# Patient Record
Sex: Male | Born: 1955
Health system: Southern US, Community
[De-identification: ages and names within clinical notes are randomized; demographics above are authoritative.]

## PROBLEM LIST (undated history)

## (undated) DIAGNOSIS — I4510 Unspecified right bundle-branch block: Secondary | ICD-10-CM

## (undated) DIAGNOSIS — E7800 Pure hypercholesterolemia, unspecified: Secondary | ICD-10-CM

## (undated) DIAGNOSIS — D649 Anemia, unspecified: Secondary | ICD-10-CM

## (undated) DIAGNOSIS — K219 Gastro-esophageal reflux disease without esophagitis: Secondary | ICD-10-CM

## (undated) DIAGNOSIS — H409 Unspecified glaucoma: Secondary | ICD-10-CM

## (undated) DIAGNOSIS — I10 Essential (primary) hypertension: Secondary | ICD-10-CM

## (undated) HISTORY — DX: Unspecified right bundle-branch block: I45.10

## (undated) HISTORY — DX: Unspecified glaucoma: H40.9

## (undated) HISTORY — PX: UPPER GASTROINTESTINAL ENDOSCOPY: SHX188

## (undated) HISTORY — DX: Essential (primary) hypertension: I10

## (undated) HISTORY — DX: Anemia, unspecified: D64.9

## (undated) HISTORY — DX: Pure hypercholesterolemia, unspecified: E78.00

## (undated) HISTORY — PX: HERNIA REPAIR: SHX51

## (undated) HISTORY — PX: UVULECTOMY: SHX2631

## (undated) HISTORY — PX: COLONOSCOPY: SHX174

## (undated) HISTORY — DX: Gastro-esophageal reflux disease without esophagitis: K21.9

---

## 2013-07-26 DIAGNOSIS — F32A Depression, unspecified: Secondary | ICD-10-CM | POA: Insufficient documentation

## 2013-07-26 DIAGNOSIS — F988 Other specified behavioral and emotional disorders with onset usually occurring in childhood and adolescence: Secondary | ICD-10-CM | POA: Insufficient documentation

## 2013-07-26 DIAGNOSIS — L821 Other seborrheic keratosis: Secondary | ICD-10-CM | POA: Insufficient documentation

## 2013-07-30 DIAGNOSIS — G4700 Insomnia, unspecified: Secondary | ICD-10-CM | POA: Insufficient documentation

## 2013-07-30 DIAGNOSIS — F5101 Primary insomnia: Secondary | ICD-10-CM | POA: Insufficient documentation

## 2013-09-29 LAB — HM COLONOSCOPY

## 2014-06-17 ENCOUNTER — Ambulatory Visit (INDEPENDENT_AMBULATORY_CARE_PROVIDER_SITE_OTHER): Payer: BLUE CROSS/BLUE SHIELD | Admitting: Internal Medicine

## 2014-06-17 DIAGNOSIS — Z9189 Other specified personal risk factors, not elsewhere classified: Secondary | ICD-10-CM

## 2014-06-17 DIAGNOSIS — Z789 Other specified health status: Secondary | ICD-10-CM

## 2014-06-17 DIAGNOSIS — Z7189 Other specified counseling: Secondary | ICD-10-CM | POA: Diagnosis not present

## 2014-06-17 DIAGNOSIS — Z23 Encounter for immunization: Secondary | ICD-10-CM

## 2014-06-17 DIAGNOSIS — Z7184 Encounter for health counseling related to travel: Secondary | ICD-10-CM

## 2014-06-17 MED ORDER — CIPROFLOXACIN HCL 500 MG PO TABS: 500.0000 mg | ORAL_TABLET | Freq: Two times a day (BID) | ORAL | Status: DC

## 2014-06-17 MED ORDER — TYPHOID VACCINE PO CPDR: 1.0000 | DELAYED_RELEASE_CAPSULE | ORAL | Status: DC

## 2014-06-17 MED ORDER — ATOVAQUONE-PROGUANIL HCL 250-100 MG PO TABS: 1.0000 | ORAL_TABLET | Freq: Every day | ORAL | Status: DC

## 2014-06-17 NOTE — Progress Notes (Signed)
   Subjective:    Patient ID: Jerry Martin, male    DOB: [birth year], 59 y.o.   MRN: [id number]  HPI  59yo M in good state of health who is going to [place] for 1 week on mission trip, July 9 ht thru 16th  Review of Systems     Objective:   Physical Exam        Assessment & Plan:  Pre-travel vaccination = gave rx for vivotif, and hepatitis A  Traveler's diarrhea = gave precautions and rx for cipro if needed  Malaria proph = gave malarone and [place] bite prevention recs

## 2015-09-12 DIAGNOSIS — F4322 Adjustment disorder with anxiety: Secondary | ICD-10-CM | POA: Insufficient documentation

## 2016-11-18 DIAGNOSIS — M47812 Spondylosis without myelopathy or radiculopathy, cervical region: Secondary | ICD-10-CM | POA: Insufficient documentation

## 2018-10-20 ENCOUNTER — Telehealth: Payer: Self-pay | Admitting: Internal Medicine

## 2018-10-20 NOTE — Telephone Encounter (Signed)
I will see him  TJ 

## 2018-10-20 NOTE — Telephone Encounter (Signed)
Copied from [place] [id number]. Topic: General - Other >> Oct 20, 2018 11:32 AM Virl Axe D wrote: Reason for CRM: Pt would like to see if any male physician in practice would accept her husband as a new patient. Pt's wife Pt's wife Jerry Martin 09/23/63 and father in law are current pt's of Dr. Quay Burow. Advised that no one in office is accepting new patients but it is up to provider.

## 2018-10-22 NOTE — Telephone Encounter (Signed)
Scheduled

## 2018-10-28 ENCOUNTER — Ambulatory Visit: Payer: Self-pay | Admitting: Internal Medicine

## 2019-02-10 ENCOUNTER — Other Ambulatory Visit: Payer: Self-pay

## 2019-02-10 ENCOUNTER — Encounter: Payer: Self-pay | Admitting: Internal Medicine

## 2019-02-10 ENCOUNTER — Ambulatory Visit (INDEPENDENT_AMBULATORY_CARE_PROVIDER_SITE_OTHER): Payer: PRIVATE HEALTH INSURANCE | Admitting: Internal Medicine

## 2019-02-10 VITALS — BP 160/92 | HR 55 | Temp 98.1°F | Resp 16 | Ht 73.0 in | Wt 222.0 lb

## 2019-02-10 DIAGNOSIS — Z23 Encounter for immunization: Secondary | ICD-10-CM | POA: Diagnosis not present

## 2019-02-10 DIAGNOSIS — K635 Polyp of colon: Secondary | ICD-10-CM | POA: Insufficient documentation

## 2019-02-10 DIAGNOSIS — I10 Essential (primary) hypertension: Secondary | ICD-10-CM | POA: Diagnosis not present

## 2019-02-10 DIAGNOSIS — E785 Hyperlipidemia, unspecified: Secondary | ICD-10-CM | POA: Diagnosis not present

## 2019-02-10 DIAGNOSIS — K582 Mixed irritable bowel syndrome: Secondary | ICD-10-CM

## 2019-02-10 DIAGNOSIS — Z0000 Encounter for general adult medical examination without abnormal findings: Secondary | ICD-10-CM | POA: Diagnosis not present

## 2019-02-10 DIAGNOSIS — R0989 Other specified symptoms and signs involving the circulatory and respiratory systems: Secondary | ICD-10-CM | POA: Diagnosis not present

## 2019-02-10 DIAGNOSIS — K589 Irritable bowel syndrome without diarrhea: Secondary | ICD-10-CM | POA: Insufficient documentation

## 2019-02-10 DIAGNOSIS — Z1159 Encounter for screening for other viral diseases: Secondary | ICD-10-CM

## 2019-02-10 LAB — CBC WITH DIFFERENTIAL/PLATELET
Basophils Absolute: 0 10*3/uL (ref 0.0–0.1)
Basophils Relative: 0.3 % (ref 0.0–3.0)
Eosinophils Absolute: 0.2 10*3/uL (ref 0.0–0.7)
Eosinophils Relative: 2.5 % (ref 0.0–5.0)
HCT: 43.6 % (ref 39.0–52.0)
Hemoglobin: 14.5 g/dL (ref 13.0–17.0)
Lymphocytes Relative: 23.1 % (ref 12.0–46.0)
Lymphs Abs: 1.5 10*3/uL (ref 0.7–4.0)
MCHC: 33.3 g/dL (ref 30.0–36.0)
MCV: 95.4 fl (ref 78.0–100.0)
Monocytes Absolute: 0.5 10*3/uL (ref 0.1–1.0)
Monocytes Relative: 7.7 % (ref 3.0–12.0)
Neutro Abs: 4.2 10*3/uL (ref 1.4–7.7)
Neutrophils Relative %: 66.4 % (ref 43.0–77.0)
Platelets: 269 10*3/uL (ref 150.0–400.0)
RBC: 4.57 Mil/uL (ref 4.22–5.81)
RDW: 13.5 % (ref 11.5–15.5)
WBC: 6.3 10*3/uL (ref 4.0–10.5)

## 2019-02-10 LAB — HEPATIC FUNCTION PANEL
ALT: 19 U/L (ref 0–53)
AST: 16 U/L (ref 0–37)
Albumin: 4.1 g/dL (ref 3.5–5.2)
Alkaline Phosphatase: 83 U/L (ref 39–117)
Bilirubin, Direct: 0.1 mg/dL (ref 0.0–0.3)
Total Bilirubin: 0.4 mg/dL (ref 0.2–1.2)
Total Protein: 6.6 g/dL (ref 6.0–8.3)

## 2019-02-10 LAB — URINALYSIS, ROUTINE W REFLEX MICROSCOPIC
Bilirubin Urine: NEGATIVE
Hgb urine dipstick: NEGATIVE
Ketones, ur: NEGATIVE
Leukocytes,Ua: NEGATIVE
Nitrite: NEGATIVE
RBC / HPF: NONE SEEN (ref 0–?)
Specific Gravity, Urine: 1.025 (ref 1.000–1.030)
Total Protein, Urine: NEGATIVE
Urine Glucose: NEGATIVE
Urobilinogen, UA: 0.2 (ref 0.0–1.0)
WBC, UA: NONE SEEN (ref 0–?)
pH: 5.5 (ref 5.0–8.0)

## 2019-02-10 LAB — BASIC METABOLIC PANEL
BUN: 17 mg/dL (ref 6–23)
CO2: 27 mEq/L (ref 19–32)
Calcium: 9.2 mg/dL (ref 8.4–10.5)
Chloride: 105 mEq/L (ref 96–112)
Creatinine, Ser: 1.07 mg/dL (ref 0.40–1.50)
GFR: 69.23 mL/min (ref >60.00)
Glucose, Bld: 102 mg/dL — ABNORMAL HIGH (ref 70–99)
Potassium: 4.6 mEq/L (ref 3.5–5.1)
Sodium: 141 mEq/L (ref 135–145)

## 2019-02-10 LAB — LIPID PANEL
Cholesterol: 216 mg/dL — ABNORMAL HIGH (ref 0–200)
HDL: 65.2 mg/dL (ref >39.00)
LDL Cholesterol: 138 mg/dL — ABNORMAL HIGH (ref 0–99)
NonHDL: 150.77
Total CHOL/HDL Ratio: 3
Triglycerides: 65 mg/dL (ref 0.0–149.0)
VLDL: 13 mg/dL (ref 0.0–40.0)

## 2019-02-10 LAB — TSH: TSH: 1.35 u[IU]/mL (ref 0.35–4.50)

## 2019-02-10 LAB — PSA: PSA: 0.31 ng/mL (ref 0.10–4.00)

## 2019-02-10 LAB — VITAMIN D 25 HYDROXY (VIT D DEFICIENCY, FRACTURES): VITD: 30.62 ng/mL (ref 30.00–100.00)

## 2019-02-10 MED ORDER — ROSUVASTATIN CALCIUM 20 MG PO TABS: 20.0000 mg | ORAL_TABLET | Freq: Every day | ORAL | 1 refills | Status: DC

## 2019-02-10 MED ORDER — EDARBI 40 MG PO TABS: 1.0000 | ORAL_TABLET | Freq: Every day | ORAL | 0 refills | Status: DC

## 2019-02-10 NOTE — Patient Instructions (Addendum)

## 2019-02-10 NOTE — Progress Notes (Addendum)
Subjective:  Patient ID: Jerry Martin, male    DOB: 07-21-1955  Age: 63 y.o. MRN: [id number]  CC: Annual Exam and Hypertension  This visit occurred during the SARS-CoV-2 public health emergency.  Safety protocols were in place, including screening questions prior to the visit, additional usage of staff PPE, and extensive cleaning of exam room while observing appropriate contact time as indicated for disinfecting solutions.   NEW TO ME  HPI Jerry PhoDavid Scatena presents for a CPX.  He complains of having bowel issues for more than a year.  He has had intermittent abdominal cramping, stool urgency, stool frequency, and now more recently constipation.  He never notices blood, mucus, or tarry stools.  He has mild heartburn that is well controlled with Pepcid.  He tells me he had a colonoscopy about a year 5 years ago.  He complains of weight gain and denies nausea, vomiting, odynophagia, or dysphagia.  He has a history of hypertension and hypercholesterolemia.  Neither of these have been recently treated.  He is very active. He exercises and denies any recent episodes of CP, DOE, palpitations, diaphoresis, edema, or fatigue.  History Onalee HuaDavid has a past medical history of GERD (gastroesophageal reflux disease), Hypercholesteremia, and Hypertension.   He has a past surgical history that includes Hernia repair and Uvulectomy.   His family history includes Hypertension in his mother.He reports that he has been smoking cigars. He has never used smokeless tobacco. He reports current alcohol use of about 15.0 standard drinks of alcohol per week. He reports current drug use. Drug: Marijuana.  Outpatient Medications Prior to Visit  Medication Sig Dispense Refill  . sertraline (ZOLOFT) 50 MG tablet   3  . amphetamine-dextroamphetamine (ADDERALL) 30 MG tablet   0  . atovaquone-proguanil (MALARONE) 250-100 MG TABS Take 1 tablet by mouth daily. Start taking on July 7th, take daily with food until complete (Patient not  taking: Reported on 02/10/2019) 16 tablet 0  . ciprofloxacin (CIPRO) 500 MG tablet Take 1 tablet (500 mg total) by mouth 2 (two) times daily. If needed if 3+loose stools or more in 24hr. stop taking if diarrhea resolves (Patient not taking: Reported on 02/10/2019) 10 tablet 0  . traZODone (DESYREL) 50 MG tablet   5  . typhoid (VIVOTIF) DR capsule Take 1 capsule by mouth every other day. (Patient not taking: Reported on 02/10/2019) 4 capsule 0   No facility-administered medications prior to visit.    ROS Review of Systems  Constitutional: Positive for unexpected weight change. Negative for diaphoresis and fatigue.  HENT: Negative.  Negative for trouble swallowing and voice change.   Eyes: Negative.   Respiratory: Negative for cough, chest tightness, shortness of breath and wheezing.   Cardiovascular: Negative for chest pain and leg swelling.  Gastrointestinal: Positive for abdominal pain, constipation and diarrhea. Negative for abdominal distention, anal bleeding, blood in stool, nausea, rectal pain and vomiting.  Endocrine: Negative.   Genitourinary: Negative.  Negative for difficulty urinating, dysuria, hematuria, testicular pain and urgency.  Musculoskeletal: Negative.   Skin: Negative.   Neurological: Negative.  Negative for dizziness, syncope and light-headedness.  Hematological: Negative.   Psychiatric/Behavioral: Negative.     Objective:  BP (!) 160/92 (BP Location: Left Arm, Patient Position: Sitting, Cuff Size: Large) Comment: BP (R) 158/94 (L) 160/92  Pulse (!) 55   Temp 98.1 F (36.7 C) (Oral)   Resp 16   Ht 6\' 1"  (1.854 m)   Wt 222 lb (100.7 kg)   SpO2 97%  BMI 29.29 kg/m   Physical Exam Vitals reviewed.  Constitutional:      Appearance: Normal appearance.  HENT:     Nose: Nose normal.     Mouth/Throat:     Mouth: Mucous membranes are moist.  Eyes:     General: No scleral icterus.    Conjunctiva/sclera: Conjunctivae normal.  Cardiovascular:     Rate and  Rhythm: Regular rhythm. Bradycardia present.     Pulses:          Carotid pulses are 1+ on the right side and 1+ on the left side.      Radial pulses are 1+ on the right side and 1+ on the left side.       Femoral pulses are 1+ on the right side and 1+ on the left side.      Popliteal pulses are 1+ on the right side and 1+ on the left side.       Dorsalis pedis pulses are 1+ on the right side and 1+ on the left side.       Posterior tibial pulses are 1+ on the right side and 1+ on the left side.     Heart sounds: No murmur. No gallop.      Comments: EKG -  Sinus bradycardia. 1st degree AV block. Left axis deviation. No LVH. Pulmonary:     Effort: Pulmonary effort is normal.     Breath sounds: No stridor. No wheezing, rhonchi or rales.  Abdominal:     General: Abdomen is flat. Bowel sounds are normal. There is abdominal bruit. There is no distension.     Palpations: Abdomen is soft. There is no hepatomegaly or splenomegaly.     Tenderness: There is no abdominal tenderness.     Hernia: No hernia is present. There is no hernia in the left inguinal area.  Genitourinary:    Pubic Area: No rash.      Penis: Normal. No discharge, swelling or lesions.      Testes: Normal.        Right: Mass or tenderness not present.        Left: Mass or tenderness not present.     Epididymis:     Right: Normal. Not inflamed or enlarged. No mass or tenderness.     Left: Not inflamed or enlarged. No mass or tenderness.     Prostate: Normal. Not enlarged, not tender and no nodules present.     Rectum: Normal. Guaiac result negative. No mass, tenderness, anal fissure, external hemorrhoid or internal hemorrhoid. Normal anal tone.  Musculoskeletal:        General: Normal range of motion.     Cervical back: Neck supple.     Right lower leg: No edema.     Left lower leg: No edema.  Lymphadenopathy:     Cervical: No cervical adenopathy.     Lower Body: No left inguinal adenopathy.  Skin:    General: Skin is  warm and dry.     Findings: No rash.  Neurological:     General: No focal deficit present.     Mental Status: He is alert.  Psychiatric:        Mood and Affect: Mood normal.        Behavior: Behavior normal.     Lab Results  Component Value Date   WBC 6.3 02/10/2019   HGB 14.5 02/10/2019   HCT 43.6 02/10/2019   PLT 269.0 02/10/2019   GLUCOSE 102 (H) 02/10/2019  CHOL 216 (H) 02/10/2019   TRIG 65.0 02/10/2019   HDL 65.20 02/10/2019   LDLCALC 138 (H) 02/10/2019   ALT 19 02/10/2019   AST 16 02/10/2019   NA 141 02/10/2019   K 4.6 02/10/2019   CL 105 02/10/2019   CREATININE 1.07 02/10/2019   BUN 17 02/10/2019   CO2 27 02/10/2019   TSH 1.35 02/10/2019   PSA 0.31 02/10/2019    Assessment & Plan:   Derran was seen today for annual exam and hypertension.  Diagnoses and all orders for this visit:  Need for pneumococcal vaccination -     Pneumococcal conjugate vaccine 13-valent  Routine general medical examination at a health care facility- Exam completed, labs reviewed, vaccines reviewed and updated, he is due for f/up colonoscopy with + hx of polyp, pt ed was given. -     Lipid panel -     PSA -     Hepatitis C antibody -     HIV antibody (Reflex)  Need for hepatitis C screening test -     Hepatitis C antibody  Essential hypertension- His BP is not well controlled. Labs are negative for end organ damage or secondary causes. I have asked him to start an ARB. -     CBC with Differential -     Basic metabolic panel -     Vitamin D 25 hydroxy -     Urinalysis, Routine w reflex microscopic -     TSH -     Hepatic function panel -     EKG 12-Lead -     Azilsartan Medoxomil (EDARBI) 40 MG TABS; Take 1 tablet by mouth daily.  Irritable bowel syndrome with both constipation and diarrhea- He does not want to treat this with a medication. Education material was given and reassurance offered. -     CBC with Differential  Polyp of colon, unspecified part of colon,  unspecified type -     Ambulatory referral to Gastroenterology  Abdominal bruit- Will screen for AAA. -     VAS [place] AAA DUPLEX; Future  Hyperlipidemia with target LDL less than 130- He has an elevated CV risk score so I have asked him to start a statin for CV risk reduction. -     rosuvastatin (CRESTOR) 20 MG tablet; Take 1 tablet (20 mg total) by mouth daily.   I have discontinued Shanon Brow Keast's amphetamine-dextroamphetamine, traZODone, ciprofloxacin, typhoid, and atovaquone-proguanil. I am also having him start on rosuvastatin and Edarbi. Additionally, I am having him maintain his sertraline.  Meds ordered this encounter  Medications  . rosuvastatin (CRESTOR) 20 MG tablet    Sig: Take 1 tablet (20 mg total) by mouth daily.    Dispense:  90 tablet    Refill:  1  . Azilsartan Medoxomil (EDARBI) 40 MG TABS    Sig: Take 1 tablet by mouth daily.    Dispense:  90 tablet    Refill:  0     Follow-up: Return in about 6 weeks (around 03/24/2019).  Scarlette Calico, MD

## 2019-02-11 ENCOUNTER — Encounter: Payer: Self-pay | Admitting: Internal Medicine

## 2019-02-11 LAB — HEPATITIS C ANTIBODY
Hepatitis C Ab: NONREACTIVE
SIGNAL TO CUT-OFF: 0.01 (ref <1.00)

## 2019-02-11 LAB — HIV ANTIBODY (ROUTINE TESTING W REFLEX): HIV 1&2 Ab, 4th Generation: NONREACTIVE

## 2019-02-13 ENCOUNTER — Encounter: Payer: Self-pay | Admitting: Internal Medicine

## 2019-02-15 ENCOUNTER — Telehealth (HOSPITAL_COMMUNITY): Payer: Self-pay | Admitting: *Deleted

## 2019-02-15 NOTE — Telephone Encounter (Signed)
02/15/19 1117 am spoke with wife asking to have patient return my call to schedule.  Since patient has not been to our office there is no HIPPA form allowing me to schedule with the patients wife.

## 2019-02-17 ENCOUNTER — Other Ambulatory Visit: Payer: Self-pay

## 2019-02-17 ENCOUNTER — Other Ambulatory Visit: Payer: Self-pay | Admitting: Internal Medicine

## 2019-02-17 ENCOUNTER — Ambulatory Visit (HOSPITAL_COMMUNITY)
Admission: RE | Admit: 2019-02-17 | Discharge: 2019-02-17 | Disposition: A | Discharge status: Home / Self Care | Payer: PRIVATE HEALTH INSURANCE | Source: Ambulatory Visit | Attending: Internal Medicine | Admitting: Internal Medicine

## 2019-02-17 ENCOUNTER — Encounter: Payer: Self-pay | Admitting: Internal Medicine

## 2019-02-17 DIAGNOSIS — I771 Stricture of artery: Secondary | ICD-10-CM

## 2019-02-17 DIAGNOSIS — R0989 Other specified symptoms and signs involving the circulatory and respiratory systems: Secondary | ICD-10-CM | POA: Insufficient documentation

## 2019-02-25 ENCOUNTER — Encounter: Payer: Self-pay | Admitting: Internal Medicine

## 2019-03-10 NOTE — Telephone Encounter (Signed)
Can an alternative to [place] be sent in please? I have tried on multiple occassions but was unsuccessful.

## 2019-03-11 ENCOUNTER — Other Ambulatory Visit: Payer: Self-pay | Admitting: Internal Medicine

## 2019-03-11 DIAGNOSIS — I10 Essential (primary) hypertension: Secondary | ICD-10-CM

## 2019-03-11 MED ORDER — IRBESARTAN 150 MG PO TABS: 150.0000 mg | ORAL_TABLET | Freq: Every day | ORAL | 1 refills | Status: DC

## 2019-03-16 ENCOUNTER — Ambulatory Visit (HOSPITAL_COMMUNITY)
Admission: RE | Admit: 2019-03-16 | Discharge: 2019-03-16 | Disposition: A | Discharge status: Home / Self Care | Payer: PRIVATE HEALTH INSURANCE | Source: Ambulatory Visit | Attending: Vascular Surgery | Admitting: Vascular Surgery

## 2019-03-16 ENCOUNTER — Other Ambulatory Visit: Payer: Self-pay

## 2019-03-16 ENCOUNTER — Encounter: Payer: Self-pay | Admitting: Vascular Surgery

## 2019-03-16 ENCOUNTER — Ambulatory Visit (INDEPENDENT_AMBULATORY_CARE_PROVIDER_SITE_OTHER): Payer: PRIVATE HEALTH INSURANCE | Admitting: Vascular Surgery

## 2019-03-16 VITALS — BP 114/73 | HR 55 | Temp 98.0°F | Resp 20 | Ht 73.0 in | Wt 216.0 lb

## 2019-03-16 DIAGNOSIS — I739 Peripheral vascular disease, unspecified: Secondary | ICD-10-CM

## 2019-03-16 DIAGNOSIS — I771 Stricture of artery: Secondary | ICD-10-CM | POA: Insufficient documentation

## 2019-03-16 NOTE — Progress Notes (Signed)
[facility]  Patient name: Jerry Martin MRN: [id number] DOB: 11-Jul-1955 Sex: male  REASON FOR CONSULT: Evaluation of aortic ectasia and left common iliac artery stenosis  HPI: Jerry Martin is a 64 y.o. male, who is here today for evaluation.  She underwent basic studies for potential aneurysm screen and also possible abdominal bruit.  This revealed maximal diameter of his aorta to be 2.6 cm.  There was some question of left common iliac artery stenosis.  He is here today for formal duplex of his iliac artery and office visit.  He has no symptoms of lower extremity claudication.  He is quite active.  He does report some hip discomfort which is not related to walking but sounds more arthritic.  He did have some left leg swelling in the past which is completely resolved.  No history of DVT.  No history of cardiac disease.  He does smoke cigars but not cigarettes  Past Medical History:  Diagnosis Date  . GERD (gastroesophageal reflux disease)   . Hypercholesteremia   . Hypertension     Family History  Problem Relation Age of Onset  . Hypertension Mother     SOCIAL HISTORY: Social History   Socioeconomic History  . Marital status: Married    Spouse name: Not on file  . Number of children: Not on file  . Years of education: Not on file  . Highest education level: Not on file  Occupational History  . Not on file  Tobacco Use  . Smoking status: Current Some Day Smoker    Types: Cigars  . Smokeless tobacco: Never Used  Substance and Sexual Activity  . Alcohol use: Yes    Alcohol/week: 15.0 standard drinks    Types: 15 Shots of liquor per week  . Drug use: Yes    Types: Marijuana  . Sexual activity: Yes    Partners: Female  Other Topics Concern  . Not on file  Social History Narrative  . Not on file   Social Determinants of Health   Financial Resource Strain:   . Difficulty of Paying Living Expenses: Not on file   Food Insecurity:   . Worried About Programme researcher, broadcasting/film/video in the Last Year: Not on file  . Ran Out of Food in the Last Year: Not on file  Transportation Needs:   . Lack of Transportation (Medical): Not on file  . Lack of Transportation (Non-Medical): Not on file  Physical Activity:   . Days of Exercise per Week: Not on file  . Minutes of Exercise per Session: Not on file  Stress:   . Feeling of Stress : Not on file  Social Connections:   . Frequency of Communication with Friends and Family: Not on file  . Frequency of Social Gatherings with Friends and Family: Not on file  . Attends Religious Services: Not on file  . Active Member of Clubs or Organizations: Not on file  . Attends Banker Meetings: Not on file  . Marital Status: Not on file  Intimate Partner Violence:   . Fear of Current or Ex-Partner: Not on file  . Emotionally Abused: Not on file  . Physically Abused: Not on file  . Sexually Abused: Not on file    Allergies  Allergen Reactions  . Penicillins     Current Outpatient Medications  Medication Sig Dispense Refill  . irbesartan (AVAPRO) 150 MG tablet Take 1 tablet (150 mg total) by mouth daily. 90 tablet  1  . rosuvastatin (CRESTOR) 20 MG tablet Take 1 tablet (20 mg total) by mouth daily. 90 tablet 1  . sertraline (ZOLOFT) 50 MG tablet   3   No current facility-administered medications for this visit.    REVIEW OF SYSTEMS:  [X]  denotes positive finding, [ ]  denotes negative finding Cardiac  Comments:  Chest pain or chest pressure:    Shortness of breath upon exertion:    Short of breath when lying flat:    Irregular heart rhythm:        Vascular    Pain in calf, thigh, or hip brought on by ambulation:    Pain in feet at night that wakes you up from your sleep:     Blood clot in your veins:    Leg swelling:  x       Pulmonary    Oxygen at home:    Productive cough:     Wheezing:         Neurologic    Sudden weakness in arms or legs:       Sudden numbness in arms or legs:     Sudden onset of difficulty speaking or slurred speech:    Temporary loss of vision in one eye:     Problems with dizziness:         Gastrointestinal    Blood in stool:     Vomited blood:         Genitourinary    Burning when urinating:     Blood in urine:        Psychiatric    Major depression:         Hematologic    Bleeding problems:    Problems with blood clotting too easily:        Skin    Rashes or ulcers:        Constitutional    Fever or chills:      PHYSICAL EXAM: Vitals:   03/16/19 0921  BP: 114/73  Pulse: (!) 55  Resp: 20  Temp: 98 F (36.7 C)  SpO2: 98%  Weight: 216 lb (98 kg)  Height: 6\' 1"  (1.854 m)    GENERAL: The patient is a well-nourished male, in no acute distress. The vital signs are documented above. CARDIOVASCULAR: Carotid arteries are without bruits bilaterally.  Heart regular rate and rhythm.  2+ radial, 2+ femoral, 2+ popliteal, 2+ dorsalis pedis and 2+ posterior tibial pulses bilaterally PULMONARY: There is good air exchange  ABDOMEN: Soft and non-tender  MUSCULOSKELETAL: There are no major deformities or cyanosis. NEUROLOGIC: No focal weakness or paresthesias are detected. SKIN: There are no ulcers or rashes noted. PSYCHIATRIC: The patient has a normal affect.  DATA:  I reviewed his noninvasive studies showing maximal diameter of his aorta at 2.6 cm.  He underwent duplex of his iliac vessels today.  This did show tortuosity of his left common iliac artery and mild elevated velocities that may be related to the tortuosity  MEDICAL ISSUES: Had long discussion with the patient regarding this.  I explained that he is not having any symptoms in all likelihood does not have any significant stenosis in his iliac system.  He has normal distal pulses at rest.  Also explained the mild ectasia of his aorta.  I would not recommend any further imaging of this.  He was reassured with this discussion will see [place]  again on an as-needed basis   Rosetta Posner, MD [facility] of  [facility] Tel [phone] Pager [phone]

## 2019-03-24 ENCOUNTER — Other Ambulatory Visit: Payer: Self-pay

## 2019-03-24 ENCOUNTER — Encounter: Payer: Self-pay | Admitting: Internal Medicine

## 2019-03-24 ENCOUNTER — Ambulatory Visit (INDEPENDENT_AMBULATORY_CARE_PROVIDER_SITE_OTHER): Payer: PRIVATE HEALTH INSURANCE | Admitting: Internal Medicine

## 2019-03-24 VITALS — BP 116/76 | HR 49 | Temp 98.1°F | Resp 16 | Ht 73.0 in | Wt 218.0 lb

## 2019-03-24 DIAGNOSIS — I10 Essential (primary) hypertension: Secondary | ICD-10-CM

## 2019-03-24 DIAGNOSIS — E785 Hyperlipidemia, unspecified: Secondary | ICD-10-CM

## 2019-03-24 NOTE — Patient Instructions (Signed)
COVID-19 Vaccine Information can be found at: [email or web address] For questions related to vaccine distribution or appointments, please email [email or web address] or call [phone].    

## 2019-03-24 NOTE — Progress Notes (Signed)
Subjective:  Patient ID: Jerry Martin, male    DOB: 07/14/1955  Age: 64 y.o. MRN: [id number]  CC: Hypertension and Hyperlipidemia  This visit occurred during the SARS-CoV-2 public health emergency.  Safety protocols were in place, including screening questions prior to the visit, additional usage of staff PPE, and extensive cleaning of exam room while observing appropriate contact time as indicated for disinfecting solutions.    HPI Jerry Martin presents for f/up - He feels well today and offers no complaints.  He is not taking the ARB because he says it caused constipation.  Since I last saw him he has also been working on his lifestyle modifications and has abstained from alcohol for at least a month.  He underwent an abdominal ultrasound and there was concern that he had stenosis in his left iliac artery but he saw vascular surgery and was told that it is a tortuous artery but not stenotic.  Outpatient Medications Prior to Visit  Medication Sig Dispense Refill  . rosuvastatin (CRESTOR) 20 MG tablet Take 1 tablet (20 mg total) by mouth daily. 90 tablet 1  . irbesartan (AVAPRO) 150 MG tablet Take 1 tablet (150 mg total) by mouth daily. 90 tablet 1  . sertraline (ZOLOFT) 50 MG tablet   3   No facility-administered medications prior to visit.    ROS Review of Systems  Constitutional: Negative.  Negative for diaphoresis, fatigue and unexpected weight change.  HENT: Negative.   Eyes: Negative.   Respiratory: Negative for chest tightness, shortness of breath and wheezing.   Cardiovascular: Negative for chest pain, palpitations and leg swelling.  Gastrointestinal: Negative for abdominal pain, constipation, diarrhea, nausea and vomiting.  Endocrine: Negative.   Genitourinary: Negative.   Musculoskeletal: Negative.  Negative for arthralgias and myalgias.  Skin: Negative.  Negative for color change and pallor.  Neurological: Negative.  Negative for dizziness, weakness and light-headedness.    Hematological: Negative for adenopathy. Does not bruise/bleed easily.  Psychiatric/Behavioral: Negative.     Objective:  BP 116/76 (BP Location: Left Arm, Patient Position: Sitting, Cuff Size: Normal)   Pulse (!) 49   Temp 98.1 F (36.7 C) (Oral)   Resp 16   Ht 6\' 1"  (1.854 m)   Wt 218 lb (98.9 kg)   SpO2 99%   BMI 28.76 kg/m   BP Readings from Last 3 Encounters:  03/24/19 116/76  03/16/19 114/73  02/10/19 (!) 160/92    Wt Readings from Last 3 Encounters:  03/24/19 218 lb (98.9 kg)  03/16/19 216 lb (98 kg)  02/10/19 222 lb (100.7 kg)    Physical Exam Vitals reviewed.  Constitutional:      Appearance: Normal appearance.  HENT:     Nose: Nose normal.     Mouth/Throat:     Mouth: Mucous membranes are moist.  Eyes:     General: No scleral icterus. Cardiovascular:     Rate and Rhythm: Normal rate and regular rhythm.     Heart sounds: No murmur.  Pulmonary:     Effort: Pulmonary effort is normal.     Breath sounds: No stridor. No wheezing, rhonchi or rales.  Abdominal:     General: Abdomen is flat. Bowel sounds are normal. There is no distension.     Palpations: Abdomen is soft. There is no hepatomegaly or splenomegaly.     Tenderness: There is no abdominal tenderness.  Musculoskeletal:        General: Normal range of motion.     Cervical back: Neck supple.  Right lower leg: No edema.     Left lower leg: No edema.  Lymphadenopathy:     Cervical: No cervical adenopathy.  Skin:    General: Skin is warm and dry.  Neurological:     General: No focal deficit present.     Mental Status: He is alert.     Lab Results  Component Value Date   WBC 6.3 02/10/2019   HGB 14.5 02/10/2019   HCT 43.6 02/10/2019   PLT 269.0 02/10/2019   GLUCOSE 102 (H) 02/10/2019   CHOL 216 (H) 02/10/2019   TRIG 65.0 02/10/2019   HDL 65.20 02/10/2019   LDLCALC 138 (H) 02/10/2019   ALT 19 02/10/2019   AST 16 02/10/2019   NA 141 02/10/2019   K 4.6 02/10/2019   CL 105  02/10/2019   CREATININE 1.07 02/10/2019   BUN 17 02/10/2019   CO2 27 02/10/2019   TSH 1.35 02/10/2019   PSA 0.31 02/10/2019    VAS US AORTA/IVC/ILIACS  Result Date: 03/16/2019 IVC/ILIAC STUDY Indications: Left iliac stenosis Risk Factors: Hyperlipidemia.  Performing Technologist: Dorthula Matas RVS, RCS  Examination Guidelines: A complete evaluation includes B-mode imaging, spectral Doppler, color Doppler, and power Doppler as needed of all accessible portions of each vessel. Bilateral testing is considered an integral part of a complete examination. Limited examinations for reoccurring indications may be performed as noted.  Abdominal Aorta Findings: +-------------+-------+----------+----------+---------+--------+--------+ Location     AP (cm)Trans (cm)PSV (cm/s)Waveform ThrombusComments +-------------+-------+----------+----------+---------+--------+--------+ LT CIA Prox                   189       biphasic minimal tortuous +-------------+-------+----------+----------+---------+--------+--------+ LT CIA Mid                    93        triphasic                 +-------------+-------+----------+----------+---------+--------+--------+ LT CIA Distal                 113       triphasic                 +-------------+-------+----------+----------+---------+--------+--------+ LT EIA Prox                   90        triphasic                 +-------------+-------+----------+----------+---------+--------+--------+ LT EIA Mid                    130       triphasic                 +-------------+-------+----------+----------+---------+--------+--------+ LT EIA Distal                 121       triphasic                 +-------------+-------+----------+----------+---------+--------+--------+  Summary: Stenosis: +-----------------+-------------+-----------------------+ Location         Stenosis     Comments                 +-----------------+-------------+-----------------------+ Left Common Iliac<50% stenosisMinimal disease present +-----------------+-------------+-----------------------+ The minimal increase in the left proximal common iliac artery appears to be due to vessel tortuosity.  *See table(s) above for measurements and observations.  Electronically signed by Gretta Began MD on 03/16/2019 at 9:49:24 AM.   Final  Assessment & Plan:   Levon was seen today for hypertension and hyperlipidemia.  Diagnoses and all orders for this visit:  Essential hypertension- His blood pressure is adequately well controlled without medication.  He was praised for improving his lifestyle modifications. -     Cancel: Basic metabolic panel  Hyperlipidemia with target LDL less than 130- He is doing well on the statin. -     Cancel: Lipid panel   I have discontinued Jerry Martin's sertraline and irbesartan. I am also having him maintain his rosuvastatin.  No orders of the defined types were placed in this encounter.    Follow-up: Return in about 1 year (around 03/23/2020).  Scarlette Calico, MD

## 2020-07-25 ENCOUNTER — Other Ambulatory Visit: Payer: Self-pay

## 2020-07-26 ENCOUNTER — Ambulatory Visit (INDEPENDENT_AMBULATORY_CARE_PROVIDER_SITE_OTHER): Payer: PPO | Admitting: Internal Medicine

## 2020-07-26 ENCOUNTER — Encounter: Payer: Self-pay | Admitting: Internal Medicine

## 2020-07-26 VITALS — BP 134/86 | HR 53 | Temp 97.9°F | Resp 16 | Ht 73.0 in | Wt 215.0 lb

## 2020-07-26 DIAGNOSIS — I10 Essential (primary) hypertension: Secondary | ICD-10-CM | POA: Diagnosis not present

## 2020-07-26 DIAGNOSIS — E785 Hyperlipidemia, unspecified: Secondary | ICD-10-CM

## 2020-07-26 DIAGNOSIS — G4725 Circadian rhythm sleep disorder, jet lag type: Secondary | ICD-10-CM | POA: Diagnosis not present

## 2020-07-26 DIAGNOSIS — Z23 Encounter for immunization: Secondary | ICD-10-CM

## 2020-07-26 DIAGNOSIS — N400 Enlarged prostate without lower urinary tract symptoms: Secondary | ICD-10-CM

## 2020-07-26 DIAGNOSIS — Z1211 Encounter for screening for malignant neoplasm of colon: Secondary | ICD-10-CM | POA: Insufficient documentation

## 2020-07-26 DIAGNOSIS — Z0000 Encounter for general adult medical examination without abnormal findings: Secondary | ICD-10-CM

## 2020-07-26 DIAGNOSIS — R001 Bradycardia, unspecified: Secondary | ICD-10-CM | POA: Insufficient documentation

## 2020-07-26 LAB — CBC WITH DIFFERENTIAL/PLATELET
Basophils Absolute: 0 10*3/uL (ref 0.0–0.1)
Basophils Relative: 0.6 % (ref 0.0–3.0)
Eosinophils Absolute: 0.3 10*3/uL (ref 0.0–0.7)
Eosinophils Relative: 4.8 % (ref 0.0–5.0)
HCT: 42.4 % (ref 39.0–52.0)
Hemoglobin: 14 g/dL (ref 13.0–17.0)
Lymphocytes Relative: 34.4 % (ref 12.0–46.0)
Lymphs Abs: 1.8 10*3/uL (ref 0.7–4.0)
MCHC: 33.1 g/dL (ref 30.0–36.0)
MCV: 91.2 fl (ref 78.0–100.0)
Monocytes Absolute: 0.4 10*3/uL (ref 0.1–1.0)
Monocytes Relative: 7.1 % (ref 3.0–12.0)
Neutro Abs: 2.8 10*3/uL (ref 1.4–7.7)
Neutrophils Relative %: 53.1 % (ref 43.0–77.0)
Platelets: 245 10*3/uL (ref 150.0–400.0)
RBC: 4.64 Mil/uL (ref 4.22–5.81)
RDW: 13.5 % (ref 11.5–15.5)
WBC: 5.2 10*3/uL (ref 4.0–10.5)

## 2020-07-26 LAB — HEPATIC FUNCTION PANEL
ALT: 24 U/L (ref 0–53)
AST: 20 U/L (ref 0–37)
Albumin: 4 g/dL (ref 3.5–5.2)
Alkaline Phosphatase: 61 U/L (ref 39–117)
Bilirubin, Direct: 0.1 mg/dL (ref 0.0–0.3)
Total Bilirubin: 0.5 mg/dL (ref 0.2–1.2)
Total Protein: 6.4 g/dL (ref 6.0–8.3)

## 2020-07-26 LAB — BASIC METABOLIC PANEL
BUN: 18 mg/dL (ref 6–23)
CO2: 27 mEq/L (ref 19–32)
Calcium: 9 mg/dL (ref 8.4–10.5)
Chloride: 106 mEq/L (ref 96–112)
Creatinine, Ser: 1.09 mg/dL (ref 0.40–1.50)
GFR: 70.46 mL/min (ref >60.00)
Glucose, Bld: 87 mg/dL (ref 70–99)
Potassium: 4.5 mEq/L (ref 3.5–5.1)
Sodium: 140 mEq/L (ref 135–145)

## 2020-07-26 LAB — LIPID PANEL
Cholesterol: 208 mg/dL — ABNORMAL HIGH (ref 0–200)
HDL: 46.8 mg/dL (ref >39.00)
LDL Cholesterol: 130 mg/dL — ABNORMAL HIGH (ref 0–99)
NonHDL: 161.05
Total CHOL/HDL Ratio: 4
Triglycerides: 155 mg/dL — ABNORMAL HIGH (ref 0.0–149.0)
VLDL: 31 mg/dL (ref 0.0–40.0)

## 2020-07-26 LAB — URINALYSIS, ROUTINE W REFLEX MICROSCOPIC
Bilirubin Urine: NEGATIVE
Hgb urine dipstick: NEGATIVE
Ketones, ur: NEGATIVE
Leukocytes,Ua: NEGATIVE
Nitrite: NEGATIVE
RBC / HPF: NONE SEEN (ref 0–?)
Specific Gravity, Urine: 1.025 (ref 1.000–1.030)
Total Protein, Urine: NEGATIVE
Urine Glucose: NEGATIVE
Urobilinogen, UA: 0.2 (ref 0.0–1.0)
pH: 6 (ref 5.0–8.0)

## 2020-07-26 LAB — PSA: PSA: 0.39 ng/mL (ref 0.10–4.00)

## 2020-07-26 MED ORDER — ZOLPIDEM TARTRATE 10 MG PO TABS: 10.0000 mg | ORAL_TABLET | Freq: Every evening | ORAL | 1 refills | Status: DC | PRN

## 2020-07-26 NOTE — Progress Notes (Signed)
Subjective:  Patient ID: Jerry Martin, male    DOB: Nov 01, 1955  Age: 65 y.o. MRN: [id number]  CC: Annual Exam (Patient requests medication for sleep as he is leaving for a mission trip to the [place])  This visit occurred during the SARS-CoV-2 public health emergency.  Safety protocols were in place, including screening questions prior to the visit, additional usage of staff PPE, and extensive cleaning of exam room while observing appropriate contact time as indicated for disinfecting solutions.    HPI Jerry Martin presents for a CPX and f/up -   He is traveling to the [place] soon and wants to get a booster on his hepatitis A vaccine.  He also wants to take Ambien for jet lag.  He has a longstanding history of low heart rate.  He is very active and denies any recent episodes of palpitations, near-syncope, syncope, dizziness, lightheadedness, edema, or fatigue.  He tells me his blood pressure is well controlled with lifestyle modifications.  He has decided not to take a statin.  Outpatient Medications Prior to Visit  Medication Sig Dispense Refill   latanoprost (XALATAN) 0.005 % ophthalmic solution Place 1 drop into the left eye at bedtime.     rosuvastatin (CRESTOR) 20 MG tablet Take 1 tablet (20 mg total) by mouth daily. (Patient not taking: Reported on 07/26/2020) 90 tablet 1   No facility-administered medications prior to visit.    ROS Review of Systems  Constitutional:  Negative for diaphoresis, fatigue and unexpected weight change.  HENT: Negative.    Eyes: Negative.   Respiratory:  Negative for cough, chest tightness, shortness of breath and wheezing.   Cardiovascular:  Negative for chest pain, palpitations and leg swelling.  Gastrointestinal:  Negative for abdominal pain, constipation, diarrhea, nausea and vomiting.  Endocrine: Negative.   Genitourinary: Negative.  Negative for difficulty urinating, scrotal swelling and testicular pain.  Musculoskeletal:  Negative.  Negative for arthralgias and myalgias.  Skin: Negative.   Neurological: Negative.  Negative for dizziness, syncope, weakness and light-headedness.  Hematological:  Negative for adenopathy. Does not bruise/bleed easily.  Psychiatric/Behavioral: Negative.     Objective:  BP 134/86 (BP Location: Right Arm, Patient Position: Sitting, Cuff Size: Large)   Pulse (!) 53   Temp 97.9 F (36.6 C) (Oral)   Resp 16   Ht 6\' 1"  (1.854 m)   Wt 215 lb (97.5 kg)   SpO2 97%   BMI 28.37 kg/m   BP Readings from Last 3 Encounters:  07/26/20 134/86  03/24/19 116/76  03/16/19 114/73    Wt Readings from Last 3 Encounters:  07/26/20 215 lb (97.5 kg)  03/24/19 218 lb (98.9 kg)  03/16/19 216 lb (98 kg)    Physical Exam Vitals reviewed.  Constitutional:      Appearance: Normal appearance.  HENT:     Nose: Nose normal.     Mouth/Throat:     Mouth: Mucous membranes are moist.  Eyes:     General: No scleral icterus.    Conjunctiva/sclera: Conjunctivae normal.  Cardiovascular:     Rate and Rhythm: Regular rhythm. Bradycardia present.     Heart sounds: Normal heart sounds, S1 normal and S2 normal. No murmur heard.   No friction rub. No gallop.     Comments: EKG- Sinus bradycardia, 50 bpm Flat T waves in I and V4-V6. No LVH or Q waves Pulmonary:     Effort: Pulmonary effort is normal.     Breath sounds: No stridor. No wheezing, rhonchi or  rales.  Abdominal:     General: Bowel sounds are normal. There is no distension.     Palpations: Abdomen is soft. There is no hepatomegaly, splenomegaly or mass.     Tenderness: There is no abdominal tenderness.     Hernia: There is no hernia in the left inguinal area or right inguinal area.  Genitourinary:    Pubic Area: No rash.      Penis: Normal.      Testes: Normal.     Epididymis:     Right: Normal.     Left: Normal.     Prostate: Enlarged (1+ smooth symm BPH). Not tender and no nodules present.     Rectum: Normal. Guaiac result  negative. No mass, tenderness, anal fissure, external hemorrhoid or internal hemorrhoid. Normal anal tone.  Musculoskeletal:     Cervical back: Neck supple.     Right lower leg: No edema.     Left lower leg: No edema.  Lymphadenopathy:     Cervical: No cervical adenopathy.     Lower Body: No right inguinal adenopathy. No left inguinal adenopathy.  Skin:    General: Skin is warm and dry.  Neurological:     General: No focal deficit present.     Mental Status: He is alert. Mental status is at baseline.  Psychiatric:        Mood and Affect: Mood normal.    Lab Results  Component Value Date   WBC 5.2 07/26/2020   HGB 14.0 07/26/2020   HCT 42.4 07/26/2020   PLT 245.0 07/26/2020   GLUCOSE 87 07/26/2020   CHOL 208 (H) 07/26/2020   TRIG 155.0 (H) 07/26/2020   HDL 46.80 07/26/2020   LDLCALC 130 (H) 07/26/2020   ALT 24 07/26/2020   AST 20 07/26/2020   NA 140 07/26/2020   K 4.5 07/26/2020   CL 106 07/26/2020   CREATININE 1.09 07/26/2020   BUN 18 07/26/2020   CO2 27 07/26/2020   TSH 1.64 07/26/2020   PSA 0.39 07/26/2020    VAS US AORTA/IVC/ILIACS  Result Date: 03/16/2019 IVC/ILIAC STUDY Indications: Left iliac stenosis Risk Factors: Hyperlipidemia.  Performing Technologist: Dorthula Matas RVS, RCS  Examination Guidelines: A complete evaluation includes B-mode imaging, spectral Doppler, color Doppler, and power Doppler as needed of all accessible portions of each vessel. Bilateral testing is considered an integral part of a complete examination. Limited examinations for reoccurring indications may be performed as noted.  Abdominal Aorta Findings: +-------------+-------+----------+----------+---------+--------+--------+ Location     AP (cm)Trans (cm)PSV (cm/s)Waveform ThrombusComments +-------------+-------+----------+----------+---------+--------+--------+ LT CIA Prox                   189       biphasic minimal tortuous  +-------------+-------+----------+----------+---------+--------+--------+ LT CIA Mid                    93        triphasic                 +-------------+-------+----------+----------+---------+--------+--------+ LT CIA Distal                 113       triphasic                 +-------------+-------+----------+----------+---------+--------+--------+ LT EIA Prox                   90        triphasic                 +-------------+-------+----------+----------+---------+--------+--------+  LT EIA Mid                    130       triphasic                 +-------------+-------+----------+----------+---------+--------+--------+ LT EIA Distal                 121       triphasic                 +-------------+-------+----------+----------+---------+--------+--------+  Summary: Stenosis: +-----------------+-------------+-----------------------+ Location         Stenosis     Comments                +-----------------+-------------+-----------------------+ Left Common Iliac<50% stenosisMinimal disease present +-----------------+-------------+-----------------------+ The minimal increase in the left proximal common iliac artery appears to be due to vessel tortuosity.  *See table(s) above for measurements and observations.  Electronically signed by Gretta Beganodd Early MD on 03/16/2019 at 9:49:24 AM.   Final     Assessment & Plan:   Jerry HuaDavid was seen today for annual exam.  Diagnoses and all orders for this visit:  Primary hypertension-his blood pressure is adequately well controlled with lifestyle modifications.  His labs are negative for secondary causes or endorgan damage and his EKG is reassuring. -     CBC with Differential/Platelet; Future -     Basic metabolic panel; Future -     Urinalysis, Routine w reflex microscopic; Future -     Cancel: TSH; Future -     EKG 12-Lead -     Thyroid Panel With TSH; Future -     Thyroid Panel With TSH -     Urinalysis, Routine w  reflex microscopic -     Basic metabolic panel -     CBC with Differential/Platelet  Routine general medical examination at a health care facility-exam completed, labs reviewed, vaccines reviewed and updated, cancer screenings addressed, patient education was given.  Hyperlipidemia with target LDL less than 130 -     Lipid panel; Future -     Cancel: TSH; Future -     Hepatic function panel; Future -     Thyroid Panel With TSH; Future -     Thyroid Panel With TSH -     Hepatic function panel -     Lipid panel  Jet lag syndrome -     zolpidem (AMBIEN) 10 MG tablet; Take 1 tablet (10 mg total) by mouth at bedtime as needed for sleep.  Benign prostatic hyperplasia without lower urinary tract symptoms- His PSA is low which is a reassuring sign that he does not have prostate cancer.  He has no symptoms that need to be treated. -     PSA; Future -     Urinalysis, Routine w reflex microscopic; Future -     Urinalysis, Routine w reflex microscopic -     PSA  Screen for colon cancer  Colon cancer screening -     Cologuard  Chronic sinus bradycardia- He is asymptomatic with respect to this.  Labs are negative for secondary causes. -     Thyroid Panel With TSH; Future -     Thyroid Panel With TSH  Other orders -     Hepatitis A vaccine adult IM -     Pneumococcal polysaccharide vaccine 23-valent greater than or equal to 2yo subcutaneous/IM  I have discontinued Jerry Huaavid Martin's rosuvastatin. I am also having him start on zolpidem. Additionally,  I am having him maintain his latanoprost.  Meds ordered this encounter  Medications   zolpidem (AMBIEN) 10 MG tablet    Sig: Take 1 tablet (10 mg total) by mouth at bedtime as needed for sleep.    Dispense:  15 tablet    Refill:  1     Follow-up: Return in about 3 months (around 10/26/2020).  Sanda Linger, MD

## 2020-07-26 NOTE — Patient Instructions (Signed)
Bradycardia, Adult Bradycardia is a slower-than-normal heartbeat. A normal resting heart rate for an adult ranges from 60 to 100 beats per minute. With bradycardia, the resting heart rate is less than 60 beats per minute. Bradycardia can prevent enough oxygen from reaching certain areas of your body when you are active. It can be serious if it keeps enough oxygen from reaching your brain and other parts of your body. Bradycardia is not a problem for everyone. For some healthy adults, a slow resting heart rate is normal. What are the causes? This condition may be caused by:  A problem with the heart, including: ? A problem with the heart's electrical system, such as a heart block. With a heart block, electrical signals between the chambers of the heart are partially or completely blocked, so they are not able to work as they should. ? A problem with the heart's natural pacemaker (sinus node). ? Heart disease. ? A heart attack. ? Heart damage. ? Lyme disease. ? A heart infection. ? A heart condition that is present at birth (congenital heart defect).  Certain medicines that treat heart conditions.  Certain conditions, such as hypothyroidism and obstructive sleep apnea.  Problems with the balance of chemicals and other substances, like potassium, in the blood.  Trauma.  Radiation therapy.   What increases the risk? You are more likely to develop this condition if you:  Are age 65 or older.  Have high blood pressure (hypertension), high cholesterol (hyperlipidemia), or diabetes.  Drink heavily, use tobacco or nicotine products, or use drugs. What are the signs or symptoms? Symptoms of this condition include:  Light-headedness.  Feeling faint or fainting.  Fatigue and weakness.  Trouble with activity or exercise.  Shortness of breath.  Chest pain (angina).  Drowsiness.  Confusion.  Dizziness. How is this diagnosed? This condition may be diagnosed based on:  Your  symptoms.  Your medical history.  A physical exam. During the exam, your health care provider will listen to your heartbeat and check your pulse. To confirm the diagnosis, your health care provider may order tests, such as:  Blood tests.  An electrocardiogram (ECG). This test records the heart's electrical activity. The test can show how fast your heart is beating and whether the heartbeat is steady.  A test in which you wear a portable device (event recorder or Holter monitor) to record your heart's electrical activity while you go about your day.  Anexercise test. How is this treated? Treatment for this condition depends on the cause of the condition and how severe your symptoms are. Treatment may involve:  Treatment of the underlying condition.  Changing your medicines or how much medicine you take.  Having a small, battery-operated device called a pacemaker implanted under the skin. When bradycardia occurs, this device can be used to increase your heart rate and help your heart beat in a regular rhythm. Follow these instructions at home: Lifestyle  Manage any health conditions that contribute to bradycardia as told by your health care provider.  Follow a heart-healthy diet. A nutrition specialist (dietitian) can help educate you about healthy food options and changes.  Follow an exercise program that is approved by your health care provider.  Maintain a healthy weight.  Try to reduce or manage your stress, such as with yoga or meditation. If you need help reducing stress, ask your health care provider.  Do not use any products that contain nicotine or tobacco, such as cigarettes, e-cigarettes, and chewing tobacco. If you need   help quitting, ask your health care provider.  Do not use illegal drugs.  Limit alcohol intake to no more than 1 drink a day for nonpregnant women and 2 drinks a day for men. Be aware of how much alcohol is in your drink. In the [place] one drink equals  one 12 oz bottle of beer (355 mL), one 5 oz glass of wine (148 mL), or one 1 oz glass of hard liquor (44 mL).   General instructions  Take over-the-counter and prescription medicines only as told by your health care provider.  Keep all follow-up visits as told by your health care provider. This is important. How is this prevented? In some cases, bradycardia may be prevented by:  Treating underlying medical problems.  Stopping behaviors or medicines that can trigger the condition. Contact a health care provider if you:  Feel light-headed or dizzy.  Almost faint.  Feel weak or are easily fatigued during physical activity.  Experience confusion or have memory problems. Get help right away if:  You faint.  You have: ? An irregular heartbeat (palpitations). ? Chest pain. ? Trouble breathing. Summary  Bradycardia is a slower-than-normal heartbeat. With bradycardia, the resting heart rate is less than 60 beats per minute.  Treatment for this condition depends on the cause.  Manage any health conditions that contribute to bradycardia as told by your health care provider.  Do not use any products that contain nicotine or tobacco, such as cigarettes, e-cigarettes, and chewing tobacco, and limit alcohol intake.  Keep all follow-up visits as told by your health care provider. This is important. This information is not intended to replace advice given to you by your health care provider. Make sure you discuss any questions you have with your health care provider. Document Revised: 08/18/2017 Document Reviewed: 07/16/2017 Elsevier Patient Education  2021 [employer] Inc[employer]  

## 2020-07-27 LAB — THYROID PANEL WITH TSH
Free Thyroxine Index: 1.7 (ref 1.4–3.8)
T3 Uptake: 33 % (ref 22–35)
T4, Total: 5.1 ug/dL (ref 4.9–10.5)
TSH: 1.64 mIU/L (ref 0.40–4.50)

## 2020-08-01 DIAGNOSIS — H40012 Open angle with borderline findings, low risk, left eye: Secondary | ICD-10-CM | POA: Diagnosis not present

## 2020-08-01 DIAGNOSIS — H52222 Regular astigmatism, left eye: Secondary | ICD-10-CM | POA: Diagnosis not present

## 2020-08-01 DIAGNOSIS — H52221 Regular astigmatism, right eye: Secondary | ICD-10-CM | POA: Diagnosis not present

## 2020-08-01 DIAGNOSIS — H43392 Other vitreous opacities, left eye: Secondary | ICD-10-CM | POA: Diagnosis not present

## 2020-08-01 DIAGNOSIS — H5211 Myopia, right eye: Secondary | ICD-10-CM | POA: Diagnosis not present

## 2020-08-01 DIAGNOSIS — H25813 Combined forms of age-related cataract, bilateral: Secondary | ICD-10-CM | POA: Diagnosis not present

## 2020-08-01 DIAGNOSIS — G43819 Other migraine, intractable, without status migrainosus: Secondary | ICD-10-CM | POA: Diagnosis not present

## 2020-08-01 DIAGNOSIS — H524 Presbyopia: Secondary | ICD-10-CM | POA: Diagnosis not present

## 2020-08-02 ENCOUNTER — Encounter: Payer: Self-pay | Admitting: Internal Medicine

## 2020-08-04 ENCOUNTER — Other Ambulatory Visit: Payer: Self-pay | Admitting: Optometry

## 2020-08-04 DIAGNOSIS — H21569 Pupillary abnormality, unspecified eye: Secondary | ICD-10-CM

## 2020-08-04 DIAGNOSIS — H40022 Open angle with borderline findings, high risk, left eye: Secondary | ICD-10-CM

## 2020-08-15 DIAGNOSIS — Z1211 Encounter for screening for malignant neoplasm of colon: Secondary | ICD-10-CM | POA: Diagnosis not present

## 2020-08-20 LAB — COLOGUARD: Cologuard: NEGATIVE

## 2020-08-29 ENCOUNTER — Telehealth (INDEPENDENT_AMBULATORY_CARE_PROVIDER_SITE_OTHER): Payer: PPO | Admitting: Family Medicine

## 2020-08-29 ENCOUNTER — Encounter: Payer: Self-pay | Admitting: Family Medicine

## 2020-08-29 VITALS — Temp 101.2°F

## 2020-08-29 DIAGNOSIS — R197 Diarrhea, unspecified: Secondary | ICD-10-CM | POA: Diagnosis not present

## 2020-08-29 NOTE — Progress Notes (Signed)
Virtual Visit via Video Note  I connected with Jerry Martin  on 08/29/20 at 10:40 AM EDT by a video enabled telemedicine application and verified that I am speaking with the correct person using two identifiers.  Location patient: home, [place] Location provider:work or home office Persons participating in the virtual visit: patient, provider, patient's wife  I discussed the limitations of evaluation and management by telemedicine and the availability of in person appointments. The patient expressed understanding and agreed to proceed.   HPI:  Acute telemedicine visit for flu like symptoms: -Onset: 2 days ago -recently returned from the [place] and most of the folks in his group got sick with similar symptoms and one member had covid -His wife had similar symptoms and was treated in an emergency room in the [place] with Cipro and Flagyl for a parasitic infection -they report they were not tested for flu or COVID -he is on cipro and flagyl from a doctor there and is doing much better today - 100% better -they had all travel related vaccines, they only drank water and ate cooked food -Symptoms include: diarrhea, nausea, HA, body aches, fever the first day -Denies:vomiting, CP, SOB, inability to eat/drink.get out of bed -Pertinent past medical history: see below -Pertinent medication allergies:  Allergies  Allergen Reactions   Penicillins    Irbesartan Other (See Comments)    constipation  -COVID-19 vaccine status: vaccinated and boosted  ROS: See pertinent positives and negatives per HPI.  Past Medical History:  Diagnosis Date   GERD (gastroesophageal reflux disease)    Hypercholesteremia    Hypertension     Past Surgical History:  Procedure Laterality Date   HERNIA REPAIR     UVULECTOMY       Current Outpatient Medications:    ciprofloxacin (CIPRO) 500 MG tablet, Take 500 mg by mouth 2 (two) times daily., Disp: , Rfl:    latanoprost (XALATAN) 0.005 %  ophthalmic solution, 1 drop at bedtime., Disp: , Rfl:    metroNIDAZOLE (FLAGYL PO), Take by mouth., Disp: , Rfl:    zolpidem (AMBIEN) 10 MG tablet, Take 1 tablet (10 mg total) by mouth at bedtime as needed for sleep., Disp: 15 tablet, Rfl: 1  EXAM:  VITALS per patient if applicable:  GENERAL: alert, oriented, appears well and in no acute distress  HEENT: atraumatic, conjunttiva clear, no obvious abnormalities on inspection of external nose and ears  NECK: normal movements of the head and neck  LUNGS: on inspection no signs of respiratory distress, breathing rate appears normal, no obvious gross SOB, gasping or wheezing  CV: no obvious cyanosis  MS: moves all visible extremities without noticeable abnormality  PSYCH/NEURO: pleasant and cooperative, no obvious depression or anxiety, speech and thought processing grossly intact  ASSESSMENT AND PLAN:  Discussed the following assessment and plan:  Diarrhea, unspecified type  -we discussed possible serious and likely etiologies, options for evaluation and workup, limitations of telemedicine visit vs in person visit, treatment, treatment risks and precautions. Pt prefers to treat via telemedicine empirically rather than in person at this moment.  Given he is doing better, opted to finish the course of Cipro and Flagyl, avoid dairy and red meat for 1 week and to a probiotic for 1 to 2 weeks.  Advised to stay hydrated. Advised low threshold to seek prompt in person care if symptoms recur or new symptoms occur.  Discussed options for inperson care if PCP office not available. Did let this patient know that I only do telemedicine  on Tuesdays and Thursdays for [place]. Advised to schedule follow up visit with PCP or [facility] if any further questions or concerns to avoid delays in care.   I discussed the assessment and treatment plan with the patient. The patient was provided an opportunity to ask questions and all were answered. The patient agreed with  the plan and demonstrated an understanding of the instructions.     Terressa Koyanagi, DO

## 2020-08-29 NOTE — Patient Instructions (Addendum)
-  Please complete the antibiotic course  -Stay hydrated and take a probiotic for 1 to 2 weeks  -Avoid dairy and red meat for 1 week  I am so glad you are feeling better!  Seek in person care promptly if your symptoms recur or new symptoms develop.  It was nice to meet you today. I help [facility] out with telemedicine visits on Tuesdays and Thursdays and am available for visits on those days. If you have any concerns or questions following this visit please schedule a follow up visit with your Primary Care doctor or seek care at a local urgent care clinic to avoid delays in care.

## 2020-09-05 ENCOUNTER — Other Ambulatory Visit: Payer: Self-pay

## 2020-09-05 ENCOUNTER — Ambulatory Visit
Admission: RE | Admit: 2020-09-05 | Discharge: 2020-09-05 | Disposition: A | Discharge status: Home / Self Care | Payer: PPO | Source: Ambulatory Visit | Attending: Optometry | Admitting: Optometry

## 2020-09-05 DIAGNOSIS — H21569 Pupillary abnormality, unspecified eye: Secondary | ICD-10-CM

## 2020-09-05 DIAGNOSIS — H40022 Open angle with borderline findings, high risk, left eye: Secondary | ICD-10-CM

## 2020-09-05 DIAGNOSIS — H5789 Other specified disorders of eye and adnexa: Secondary | ICD-10-CM | POA: Diagnosis not present

## 2020-09-05 IMAGING — MR MR ORBITS WO/W CM
5 of 7 series · 19 of 48 positions shown · IV contrast (2 ML MULTIHANCE)
Comparison: None.

CLINICAL DATA: Afferent pupillary defect. Side not stipulated. Open
angle, high risk left.

EXAM:
MRI HEAD AND ORBITS WITHOUT AND WITH CONTRAST
TECHNIQUE: Multiplanar, multiecho pulse sequences of the brain and surrounding
structures were obtained without and with intravenous contrast.
Multiplanar, multiecho pulse sequences of the orbits and surrounding
structures were obtained including fat saturation techniques, before
and after intravenous contrast administration.
CONTRAST:  20mL MULTIHANCE GADOBENATE DIMEGLUMINE 529 MG/ML IV SOLN

[Series 1: t2_ax_fs_3mm · axial · 3.0mm · 0.35mm/px · z∈[-33,+24]mm · 2 of 17 slices shown]
[im 1/17]
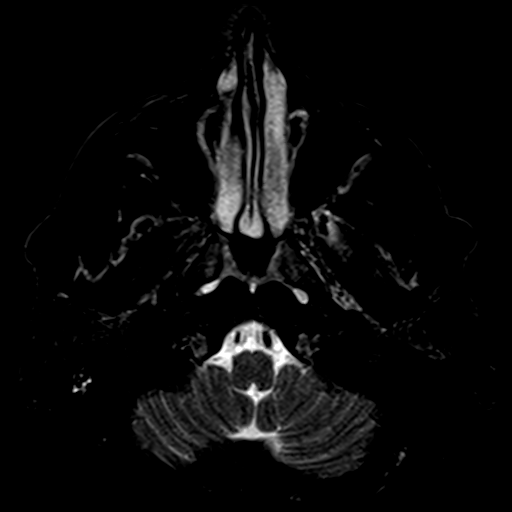
[im 17/17]
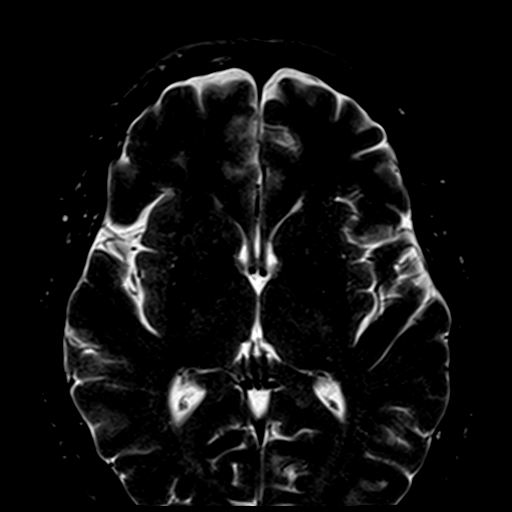

[Series 2: t2_cor_fs_3mm · coronal · 3.0mm · 0.35mm/px · 4 of 29 slices shown]
[im 1/29]
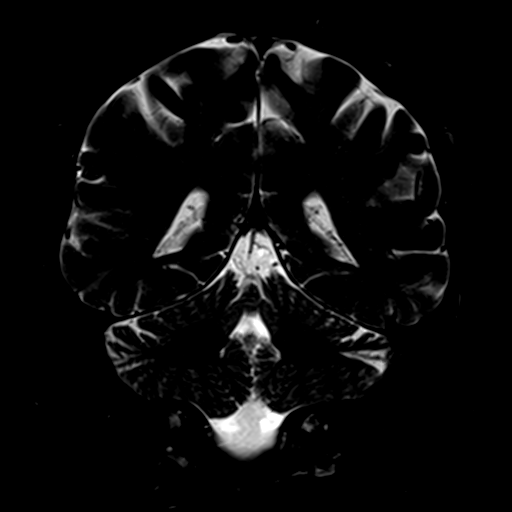
[im 10/29]
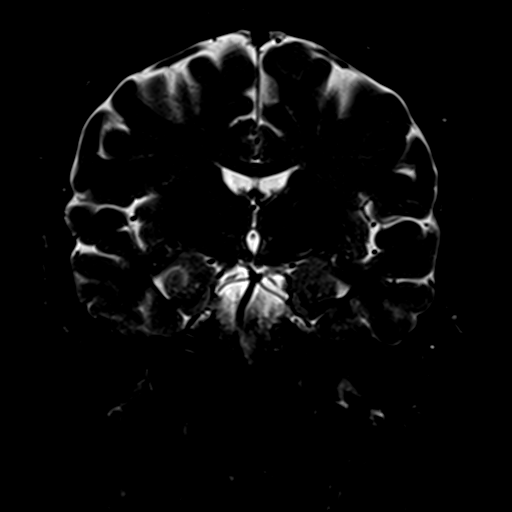
[im 19/29]
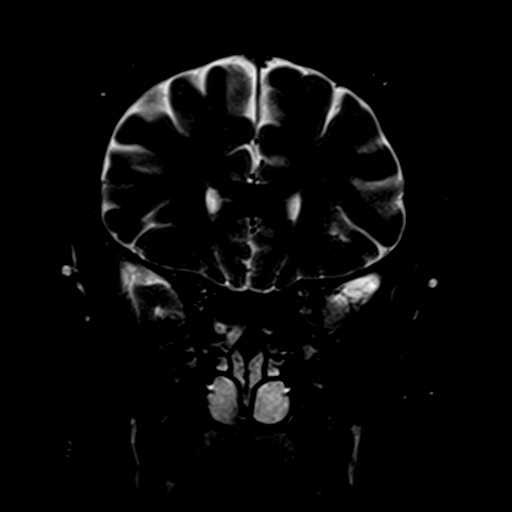
[im 29/29]
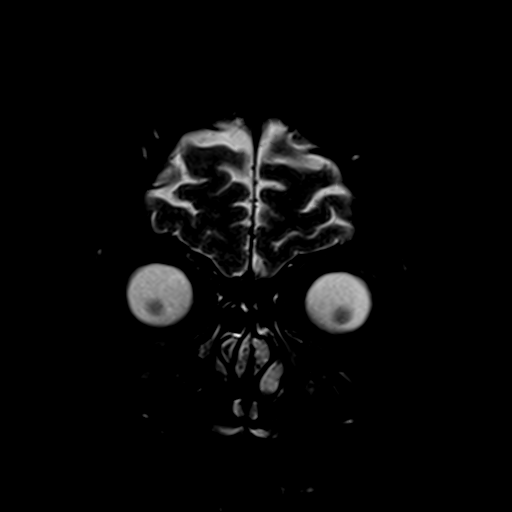

[Series 3: t1_tse axial_3mm · axial · 3.0mm · 0.35mm/px · z∈[-33,+24]mm · 3 of 17 slices shown]
[im 1/17]
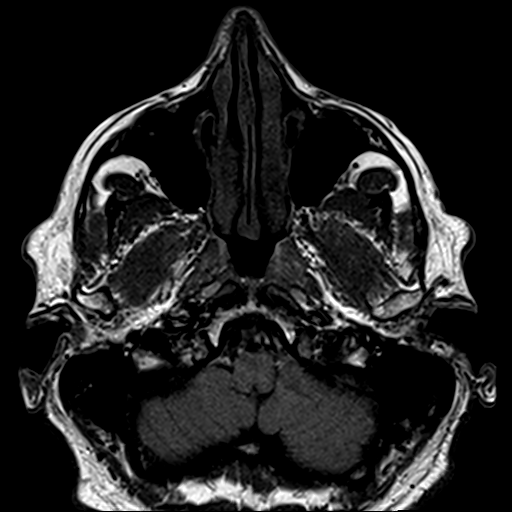
[im 9/17]
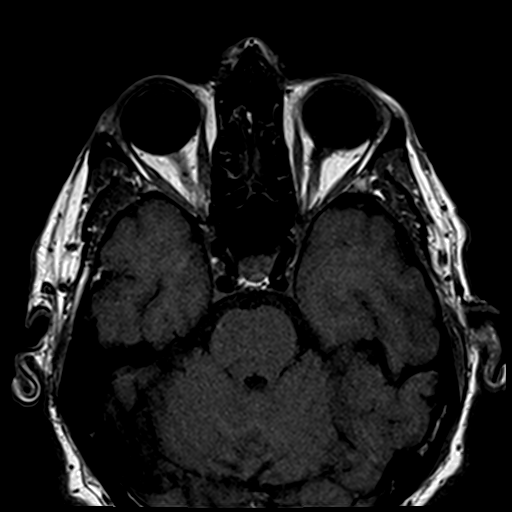
[im 17/17]
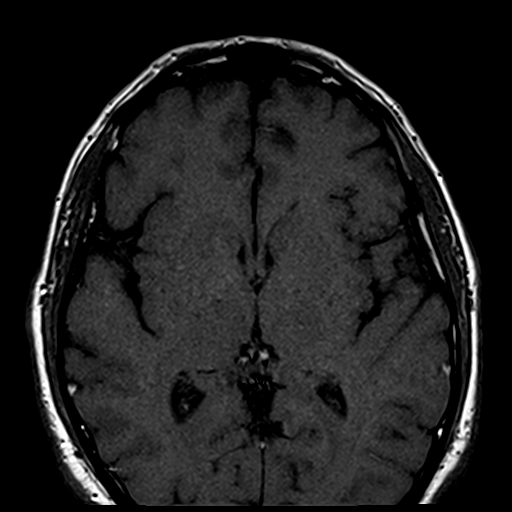

[Series 4: t1_tse cor_3mm · coronal · 3.0mm · 0.35mm/px · 5 of 29 slices shown]
[im 1/29]
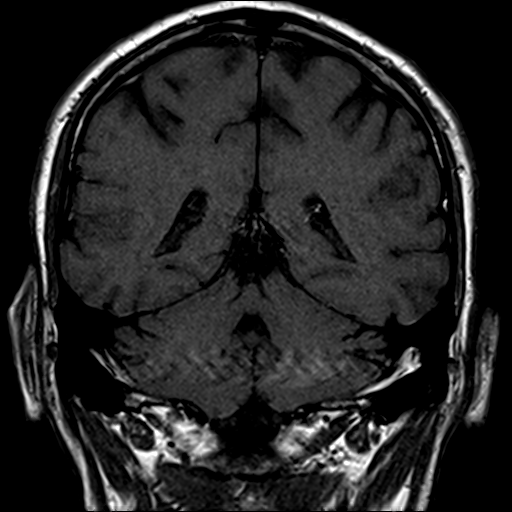
[im 8/29]
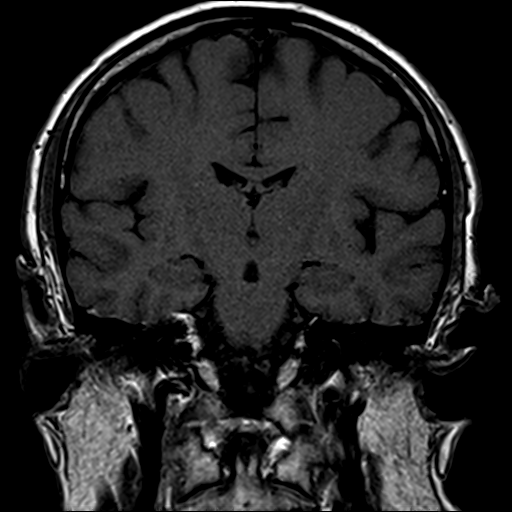
[im 15/29]
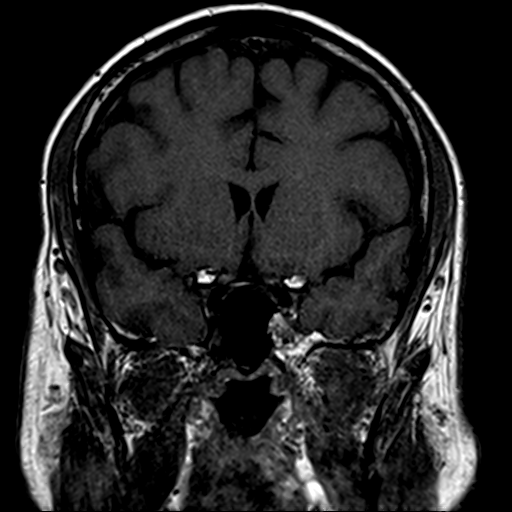
[im 22/29]
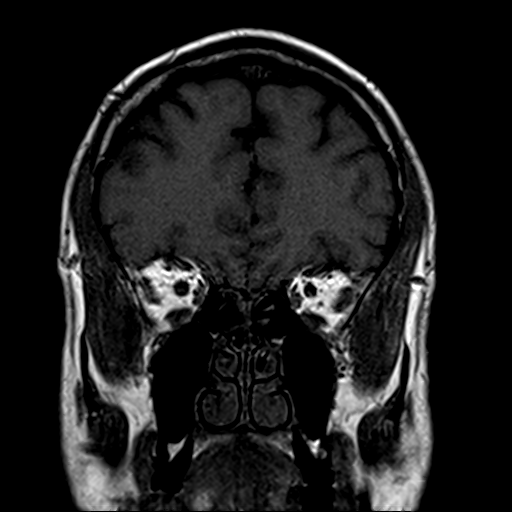
[im 29/29]
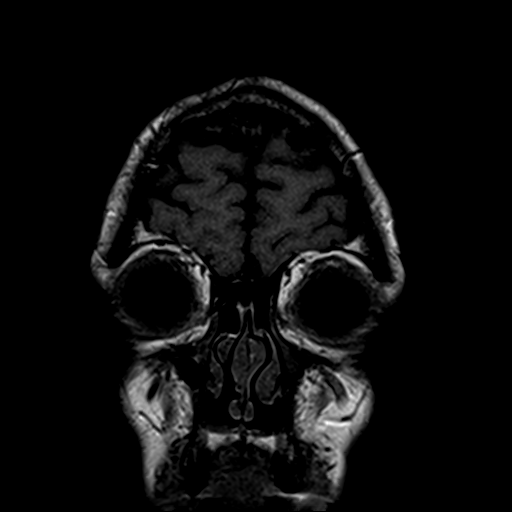

[Series 6: T1 post-contrast · coronal · 3.0mm · 0.35mm/px · 5 of 29 slices shown]
[im 1/29]
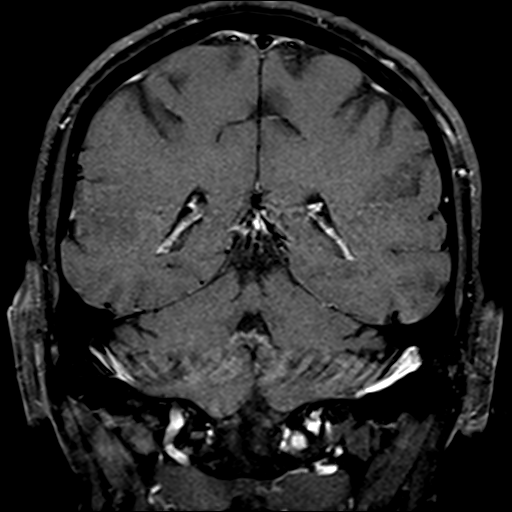
[im 8/29]
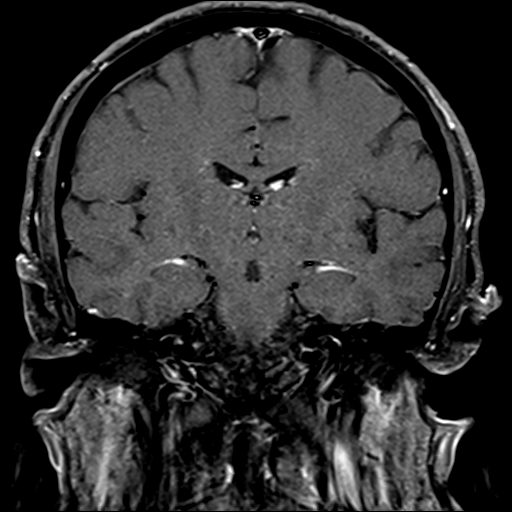
[im 15/29]
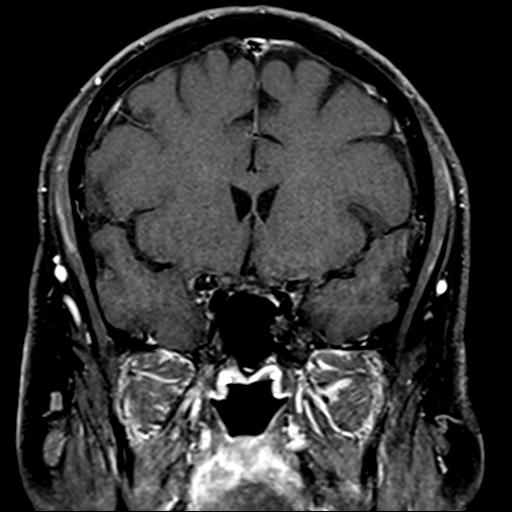
[im 22/29]
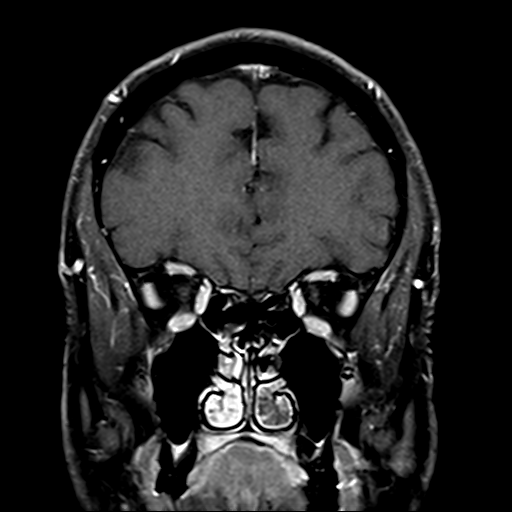
[im 29/29]
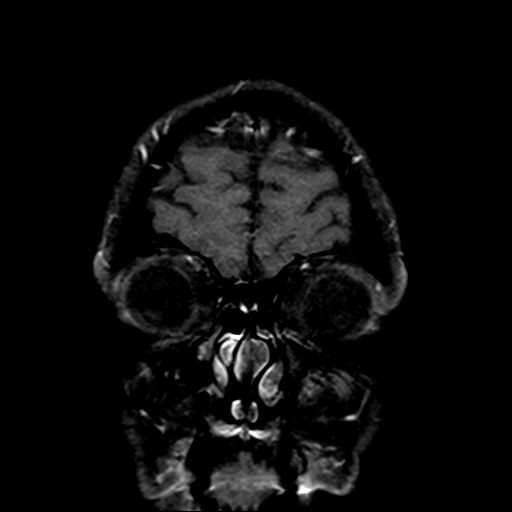

[19 of 48 positions shown; findings below may reference images not displayed]

FINDINGS: MRI HEAD FINDINGS

Brain: Normal appearance without evidence of malformation, atrophy,
old or acute infarction, mass lesion, hemorrhage, hydrocephalus or
extra-axial collection. No lesion seen affecting the tectum or optic
pathway regions. Optic chiasm region appears normal. After contrast
administration, no abnormal enhancement occurs.

Vascular: Major vessels at the base of the brain show flow.

Skull and upper cervical spine: Negative

Other: None

MRI ORBITS FINDINGS

Orbits: Both globes appear normal. No retinal abnormality is seen.
Both optic nerves appear symmetric and normal. Optic nerve sheaths
are normal. No optic nerve enhancement or abnormal T2 signal.
Orbital apices are normal. Optic chiasm is normal. Visualized optic
tract regions are normal. Tectum is normal.

Orbital fat and extraocular muscles are normal. Lacrimal glands are
normal. No abnormal regional enhancement.

Visualized sinuses: Clear except for small amount of fluid in the
right division of the sphenoid sinus, not significant

Soft tissues: Regional soft tissues are otherwise normal.
IMPRESSION: Normal MRI of the brain. Normal MRI of the orbits. No lesion seen to
explain afferent pupillary defect.

## 2020-09-05 IMAGING — MR MR HEAD WO/W CM
11 series · 48 of 48 positions shown · IV contrast (multihance)
Comparison: None.

CLINICAL DATA: Afferent pupillary defect. Side not stipulated. Open
angle, high risk left.

EXAM:
MRI HEAD AND ORBITS WITHOUT AND WITH CONTRAST
TECHNIQUE: Multiplanar, multiecho pulse sequences of the brain and surrounding
structures were obtained without and with intravenous contrast.
Multiplanar, multiecho pulse sequences of the orbits and surrounding
structures were obtained including fat saturation techniques, before
and after intravenous contrast administration.
CONTRAST:  20mL MULTIHANCE GADOBENATE DIMEGLUMINE 529 MG/ML IV SOLN

[Series 2: T1 · sagittal · 5.0mm · 0.45mm/px · 3 of 25 slices shown]
[im 1/25]
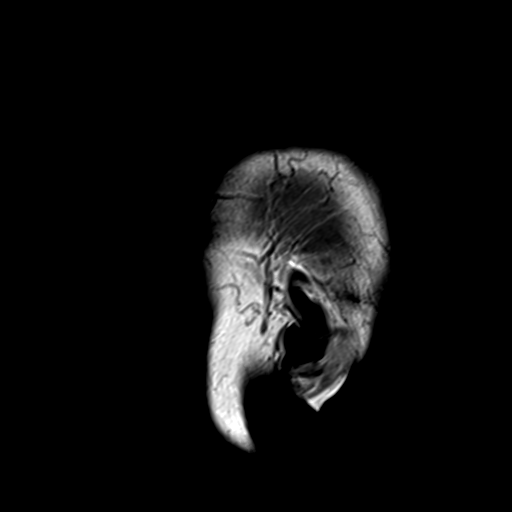
[im 13/25]
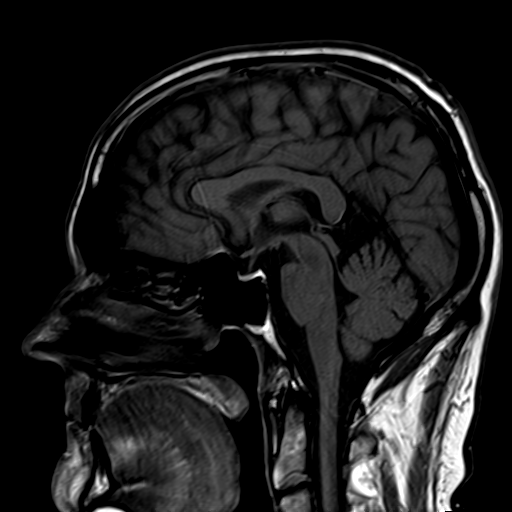
[im 25/25]
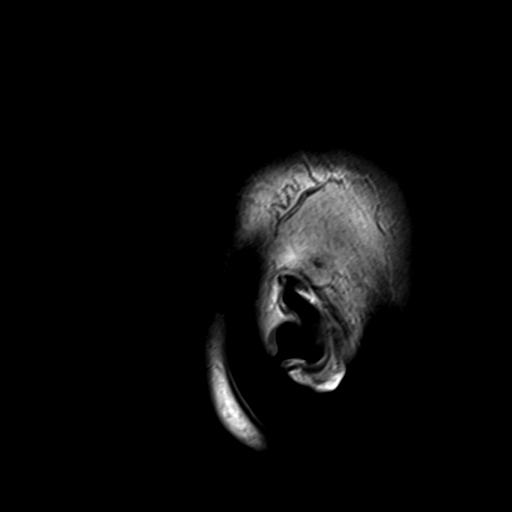

[Series 3: ax ep2d_diff_3 · axial · 3.0mm · 1.80mm/px · z∈[-50,+110]mm · 8 of 108 slices shown]
[im 1/108]
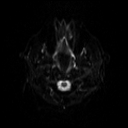
[im 16/108]
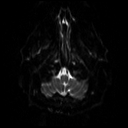
[im 31/108]
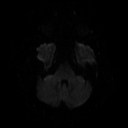
[im 46/108]
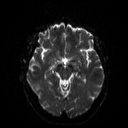
[im 62/108]
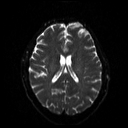
[im 77/108]
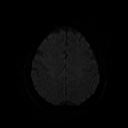
[im 92/108]
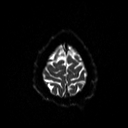
[im 108/108]
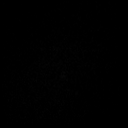

[Series 4: ax ep2d_diff_3_adc · axial · 3.0mm · 1.80mm/px · z∈[-50,+110]mm · 4 of 55 slices shown]
[im 1/55]
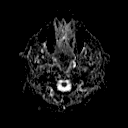
[im 19/55]
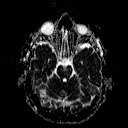
[im 37/55]
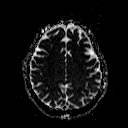
[im 55/55]
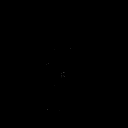

[Series 5: cor ep2d_diff · coronal · 5.0mm · 1.77mm/px · 5 of 60 slices shown]
[im 1/60]
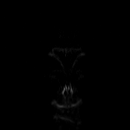
[im 15/60]
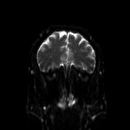
[im 30/60]
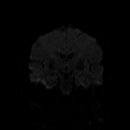
[im 45/60]
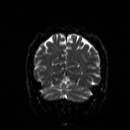
[im 60/60]
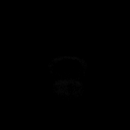

[Series 6: cor ep2d_diff_adc · coronal · 5.0mm · 1.77mm/px · 2 of 30 slices shown]
[im 1/30]
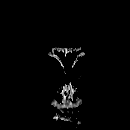
[im 30/30]
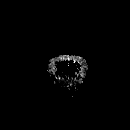

[Series 8: swi_images · axial · 2.0mm · 0.98mm/px · z∈[-46,+110]mm · 6 of 80 slices shown]
[im 1/80]
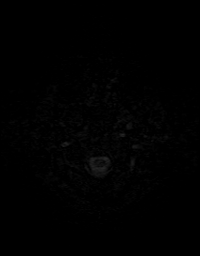
[im 16/80]
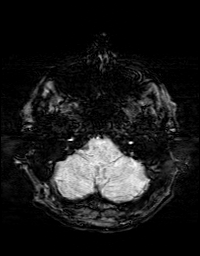
[im 32/80]
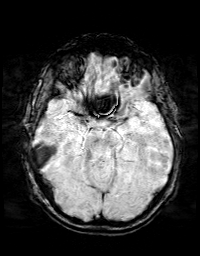
[im 48/80]
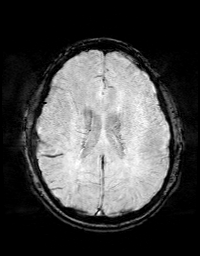
[im 64/80]
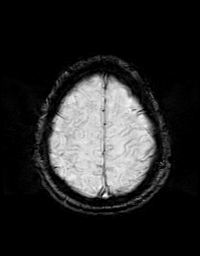
[im 80/80]
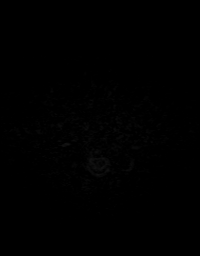

[Series 9: t2_tse_tra_512 · axial · 5.0mm · 0.60mm/px · z∈[-43,+105]mm · 2 of 26 slices shown]
[im 1/26]
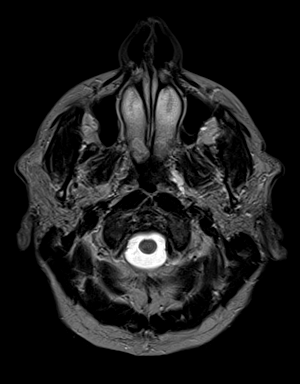
[im 26/26]
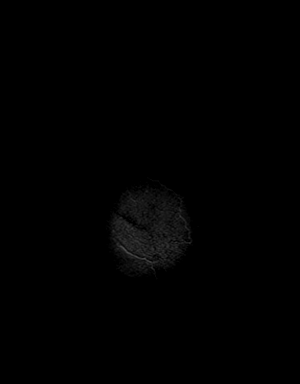

[Series 10: FLAIR · axial · 3.0mm · 0.43mm/px · z∈[-45,+105]mm · 3 of 40 slices shown]
[im 1/40]
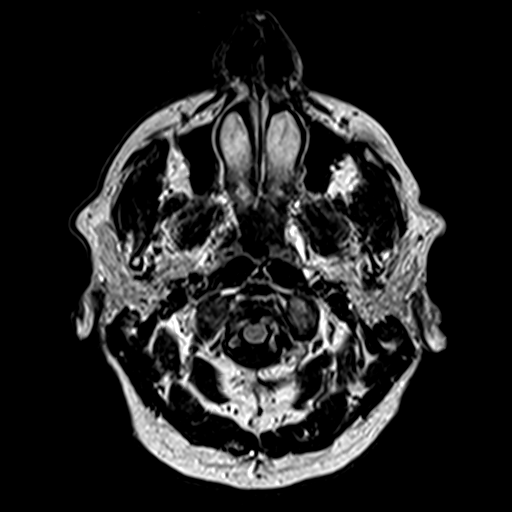
[im 20/40]
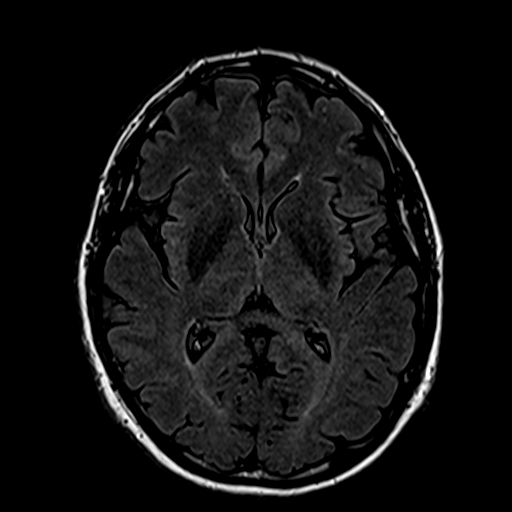
[im 40/40]
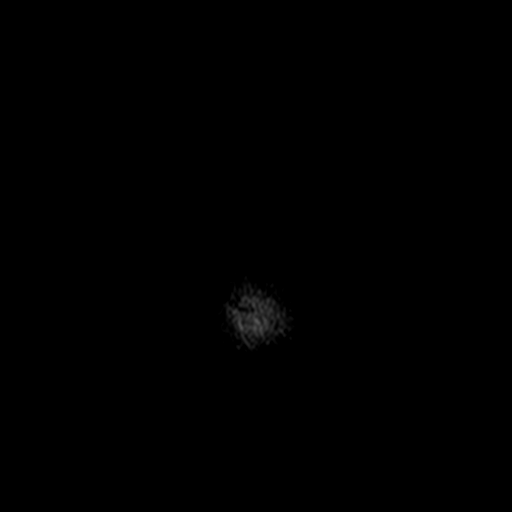

[Series 11: T2 · coronal · 5.0mm · 0.45mm/px · 2 of 31 slices shown]
[im 1/31]
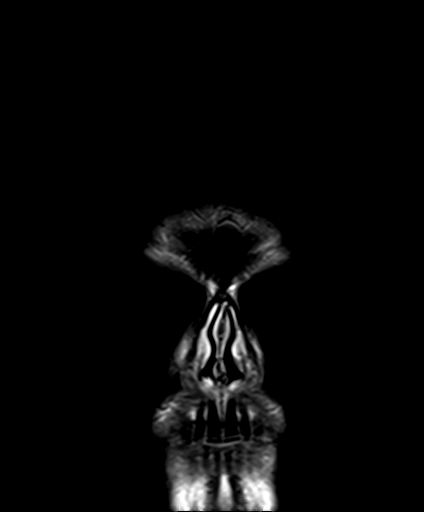
[im 31/31]
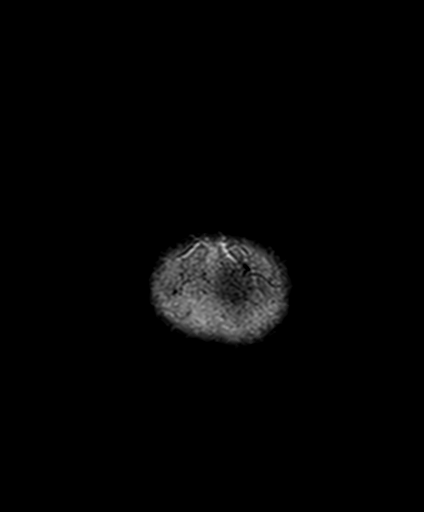

[Series 12: post t1_mpr_tra · axial · 1.0mm · 0.72mm/px · z∈[-37,+104]mm · 11 of 144 slices shown]
[im 1/144]
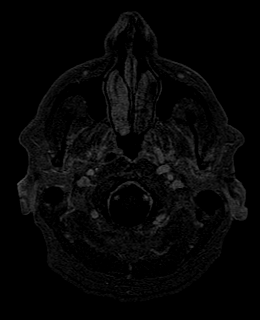
[im 15/144]
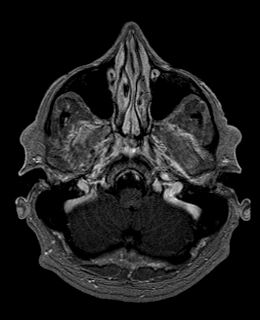
[im 29/144]
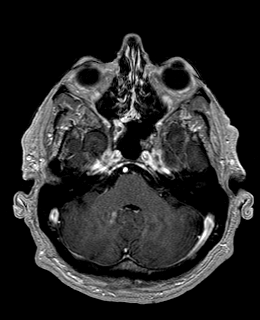
[im 43/144]
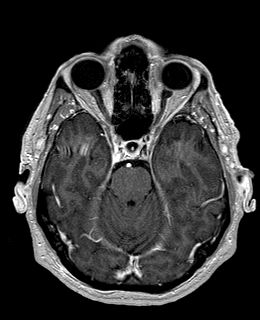
[im 58/144]
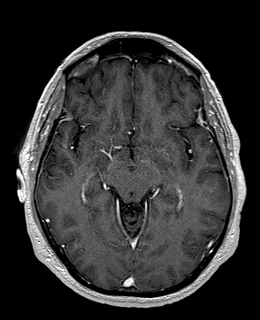
[im 72/144]
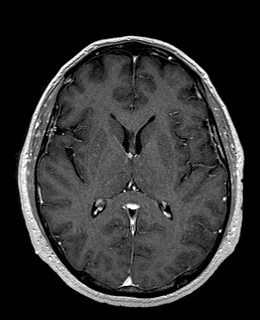
[im 86/144]
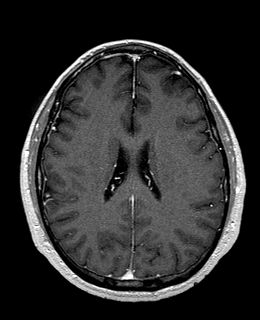
[im 101/144]
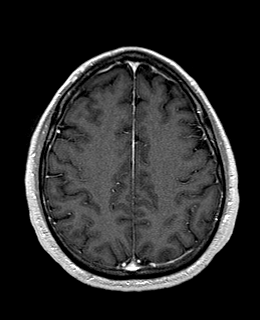
[im 115/144]
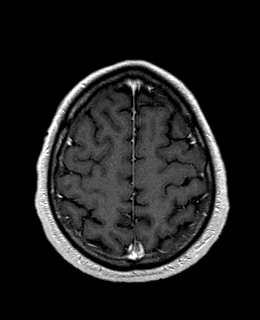
[im 129/144]
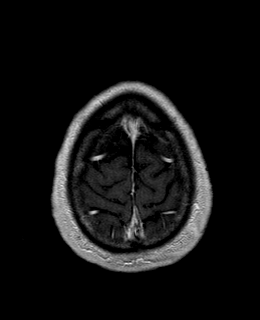
[im 144/144]
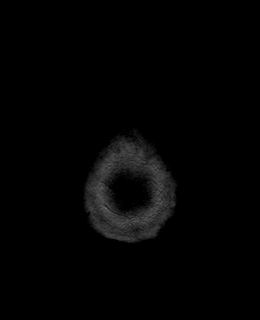

[Series 13: T1 post-contrast · coronal · 5.0mm · 0.45mm/px · 2 of 26 slices shown]
[im 1/26]
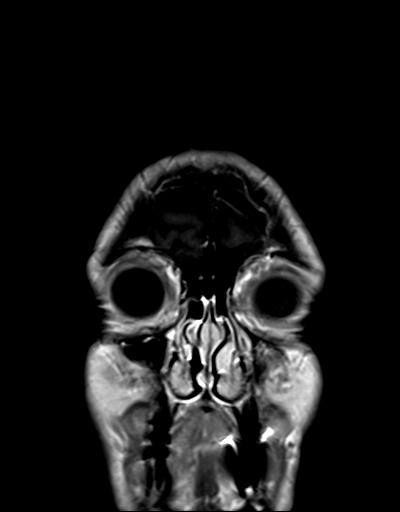
[im 26/26]
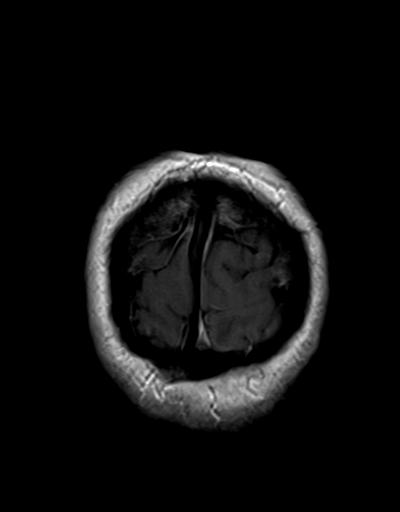

[48 of 48 positions shown; findings below may reference images not displayed]

FINDINGS: MRI HEAD FINDINGS

Brain: Normal appearance without evidence of malformation, atrophy,
old or acute infarction, mass lesion, hemorrhage, hydrocephalus or
extra-axial collection. No lesion seen affecting the tectum or optic
pathway regions. Optic chiasm region appears normal. After contrast
administration, no abnormal enhancement occurs.

Vascular: Major vessels at the base of the brain show flow.

Skull and upper cervical spine: Negative

Other: None

MRI ORBITS FINDINGS

Orbits: Both globes appear normal. No retinal abnormality is seen.
Both optic nerves appear symmetric and normal. Optic nerve sheaths
are normal. No optic nerve enhancement or abnormal T2 signal.
Orbital apices are normal. Optic chiasm is normal. Visualized optic
tract regions are normal. Tectum is normal.

Orbital fat and extraocular muscles are normal. Lacrimal glands are
normal. No abnormal regional enhancement.

Visualized sinuses: Clear except for small amount of fluid in the
right division of the sphenoid sinus, not significant

Soft tissues: Regional soft tissues are otherwise normal.
IMPRESSION: Normal MRI of the brain. Normal MRI of the orbits. No lesion seen to
explain afferent pupillary defect.

## 2020-09-05 MED ORDER — GADOBENATE DIMEGLUMINE 529 MG/ML IV SOLN
20.0000 mL | Freq: Once | INTRAVENOUS | Status: AC | PRN
  Administered 2020-09-05: 20 mL via INTRAVENOUS

## 2020-10-30 DIAGNOSIS — H04123 Dry eye syndrome of bilateral lacrimal glands: Secondary | ICD-10-CM | POA: Diagnosis not present

## 2020-10-30 DIAGNOSIS — H25813 Combined forms of age-related cataract, bilateral: Secondary | ICD-10-CM | POA: Diagnosis not present

## 2020-10-30 DIAGNOSIS — H401222 Low-tension glaucoma, left eye, moderate stage: Secondary | ICD-10-CM | POA: Diagnosis not present

## 2021-03-12 DIAGNOSIS — H04123 Dry eye syndrome of bilateral lacrimal glands: Secondary | ICD-10-CM | POA: Diagnosis not present

## 2021-03-12 DIAGNOSIS — H25813 Combined forms of age-related cataract, bilateral: Secondary | ICD-10-CM | POA: Diagnosis not present

## 2021-03-12 DIAGNOSIS — H401222 Low-tension glaucoma, left eye, moderate stage: Secondary | ICD-10-CM | POA: Diagnosis not present

## 2021-03-12 DIAGNOSIS — G43809 Other migraine, not intractable, without status migrainosus: Secondary | ICD-10-CM | POA: Diagnosis not present

## 2021-04-23 DIAGNOSIS — G43809 Other migraine, not intractable, without status migrainosus: Secondary | ICD-10-CM | POA: Diagnosis not present

## 2021-04-23 DIAGNOSIS — H04123 Dry eye syndrome of bilateral lacrimal glands: Secondary | ICD-10-CM | POA: Diagnosis not present

## 2021-04-23 DIAGNOSIS — H401222 Low-tension glaucoma, left eye, moderate stage: Secondary | ICD-10-CM | POA: Diagnosis not present

## 2021-04-23 DIAGNOSIS — H25813 Combined forms of age-related cataract, bilateral: Secondary | ICD-10-CM | POA: Diagnosis not present

## 2021-06-13 DIAGNOSIS — H04123 Dry eye syndrome of bilateral lacrimal glands: Secondary | ICD-10-CM | POA: Diagnosis not present

## 2021-06-13 DIAGNOSIS — H401222 Low-tension glaucoma, left eye, moderate stage: Secondary | ICD-10-CM | POA: Diagnosis not present

## 2021-06-13 DIAGNOSIS — H43392 Other vitreous opacities, left eye: Secondary | ICD-10-CM | POA: Diagnosis not present

## 2021-08-27 DIAGNOSIS — H401222 Low-tension glaucoma, left eye, moderate stage: Secondary | ICD-10-CM | POA: Diagnosis not present

## 2021-08-27 DIAGNOSIS — H04123 Dry eye syndrome of bilateral lacrimal glands: Secondary | ICD-10-CM | POA: Diagnosis not present

## 2021-09-24 ENCOUNTER — Ambulatory Visit (INDEPENDENT_AMBULATORY_CARE_PROVIDER_SITE_OTHER): Payer: PPO | Admitting: Internal Medicine

## 2021-09-24 ENCOUNTER — Encounter: Payer: Self-pay | Admitting: Internal Medicine

## 2021-09-24 VITALS — BP 118/72 | HR 51 | Temp 98.1°F | Resp 16 | Ht 73.0 in | Wt 219.0 lb

## 2021-09-24 DIAGNOSIS — I10 Essential (primary) hypertension: Secondary | ICD-10-CM

## 2021-09-24 DIAGNOSIS — I4510 Unspecified right bundle-branch block: Secondary | ICD-10-CM

## 2021-09-24 DIAGNOSIS — R001 Bradycardia, unspecified: Secondary | ICD-10-CM | POA: Diagnosis not present

## 2021-09-24 DIAGNOSIS — Z23 Encounter for immunization: Secondary | ICD-10-CM

## 2021-09-24 DIAGNOSIS — E785 Hyperlipidemia, unspecified: Secondary | ICD-10-CM

## 2021-09-24 DIAGNOSIS — N400 Enlarged prostate without lower urinary tract symptoms: Secondary | ICD-10-CM

## 2021-09-24 DIAGNOSIS — Z0000 Encounter for general adult medical examination without abnormal findings: Secondary | ICD-10-CM | POA: Diagnosis not present

## 2021-09-24 LAB — CBC WITH DIFFERENTIAL/PLATELET
Basophils Absolute: 0 10*3/uL (ref 0.0–0.1)
Basophils Relative: 0.5 % (ref 0.0–3.0)
Eosinophils Absolute: 0.2 10*3/uL (ref 0.0–0.7)
Eosinophils Relative: 4.4 % (ref 0.0–5.0)
HCT: 43.5 % (ref 39.0–52.0)
Hemoglobin: 14.7 g/dL (ref 13.0–17.0)
Lymphocytes Relative: 33.1 % (ref 12.0–46.0)
Lymphs Abs: 1.9 10*3/uL (ref 0.7–4.0)
MCHC: 33.8 g/dL (ref 30.0–36.0)
MCV: 91.6 fl (ref 78.0–100.0)
Monocytes Absolute: 0.4 10*3/uL (ref 0.1–1.0)
Monocytes Relative: 7.7 % (ref 3.0–12.0)
Neutro Abs: 3 10*3/uL (ref 1.4–7.7)
Neutrophils Relative %: 54.3 % (ref 43.0–77.0)
Platelets: 255 10*3/uL (ref 150.0–400.0)
RBC: 4.75 Mil/uL (ref 4.22–5.81)
RDW: 12.8 % (ref 11.5–15.5)
WBC: 5.6 10*3/uL (ref 4.0–10.5)

## 2021-09-24 LAB — BASIC METABOLIC PANEL
BUN: 18 mg/dL (ref 6–23)
CO2: 26 mEq/L (ref 19–32)
Calcium: 9.2 mg/dL (ref 8.4–10.5)
Chloride: 103 mEq/L (ref 96–112)
Creatinine, Ser: 1.16 mg/dL (ref 0.40–1.50)
GFR: 64.85 mL/min (ref >60.00)
Glucose, Bld: 93 mg/dL (ref 70–99)
Potassium: 4.5 mEq/L (ref 3.5–5.1)
Sodium: 142 mEq/L (ref 135–145)

## 2021-09-24 LAB — LIPID PANEL
Cholesterol: 231 mg/dL — ABNORMAL HIGH (ref 0–200)
HDL: 42 mg/dL (ref >39.00)
LDL Cholesterol: 155 mg/dL — ABNORMAL HIGH (ref 0–99)
NonHDL: 188.74
Total CHOL/HDL Ratio: 5
Triglycerides: 168 mg/dL — ABNORMAL HIGH (ref 0.0–149.0)
VLDL: 33.6 mg/dL (ref 0.0–40.0)

## 2021-09-24 LAB — URINALYSIS, ROUTINE W REFLEX MICROSCOPIC
Bilirubin Urine: NEGATIVE
Hgb urine dipstick: NEGATIVE
Ketones, ur: NEGATIVE
Leukocytes,Ua: NEGATIVE
Nitrite: NEGATIVE
Specific Gravity, Urine: 1.02 (ref 1.000–1.030)
Total Protein, Urine: NEGATIVE
Urine Glucose: NEGATIVE
Urobilinogen, UA: 0.2 (ref 0.0–1.0)
pH: 6 (ref 5.0–8.0)

## 2021-09-24 LAB — HEPATIC FUNCTION PANEL
ALT: 25 U/L (ref 0–53)
AST: 19 U/L (ref 0–37)
Albumin: 4.2 g/dL (ref 3.5–5.2)
Alkaline Phosphatase: 59 U/L (ref 39–117)
Bilirubin, Direct: 0.1 mg/dL (ref 0.0–0.3)
Total Bilirubin: 0.4 mg/dL (ref 0.2–1.2)
Total Protein: 6.8 g/dL (ref 6.0–8.3)

## 2021-09-24 LAB — PSA: PSA: 0.31 ng/mL (ref 0.10–4.00)

## 2021-09-24 LAB — TSH: TSH: 1.82 u[IU]/mL (ref 0.35–5.50)

## 2021-09-24 MED ORDER — ROSUVASTATIN CALCIUM 20 MG PO TABS: 20.0000 mg | ORAL_TABLET | Freq: Every day | ORAL | 3 refills | Status: DC

## 2021-09-24 NOTE — Patient Instructions (Signed)
Health Maintenance, Male Adopting a healthy lifestyle and getting preventive care are important in promoting health and wellness. Ask your health care provider about: The right schedule for you to have regular tests and exams. Things you can do on your own to prevent diseases and keep yourself healthy. What should I know about diet, weight, and exercise? Eat a healthy diet  Eat a diet that includes plenty of vegetables, fruits, low-fat dairy products, and lean protein. Do not eat a lot of foods that are high in solid fats, added sugars, or sodium. Maintain a healthy weight Body mass index (BMI) is a measurement that can be used to identify possible weight problems. It estimates body fat based on height and weight. Your health care provider can help determine your BMI and help you achieve or maintain a healthy weight. Get regular exercise Get regular exercise. This is one of the most important things you can do for your health. Most adults should: Exercise for at least 150 minutes each week. The exercise should increase your heart rate and make you sweat (moderate-intensity exercise). Do strengthening exercises at least twice a week. This is in addition to the moderate-intensity exercise. Spend less time sitting. Even light physical activity can be beneficial. Watch cholesterol and blood lipids Have your blood tested for lipids and cholesterol at 68 years of age, then have this test every 5 years. You may need to have your cholesterol levels checked more often if: Your lipid or cholesterol levels are high. You are older than 68 years of age. You are at high risk for heart disease. What should I know about cancer screening? Many types of cancers can be detected early and may often be prevented. Depending on your health history and family history, you may need to have cancer screening at various ages. This may include screening for: Colorectal cancer. Prostate cancer. Skin cancer. Lung  cancer. What should I know about heart disease, diabetes, and high blood pressure? Blood pressure and heart disease High blood pressure causes heart disease and increases the risk of stroke. This is more likely to develop in people who have high blood pressure readings or are overweight. Talk with your health care provider about your target blood pressure readings. Have your blood pressure checked: Every 3-5 years if you are 18-39 years of age. Every year if you are 40 years old or older. If you are between the ages of 65 and 75 and are a current or former smoker, ask your health care provider if you should have a one-time screening for abdominal aortic aneurysm (AAA). Diabetes Have regular diabetes screenings. This checks your fasting blood sugar level. Have the screening done: Once every three years after age 45 if you are at a normal weight and have a low risk for diabetes. More often and at a younger age if you are overweight or have a high risk for diabetes. What should I know about preventing infection? Hepatitis B If you have a higher risk for hepatitis B, you should be screened for this virus. Talk with your health care provider to find out if you are at risk for hepatitis B infection. Hepatitis C Blood testing is recommended for: Everyone born from 1945 through 1965. Anyone with known risk factors for hepatitis C. Sexually transmitted infections (STIs) You should be screened each year for STIs, including gonorrhea and chlamydia, if: You are sexually active and are younger than 68 years of age. You are older than 68 years of age and your   health care provider tells you that you are at risk for this type of infection. Your sexual activity has changed since you were last screened, and you are at increased risk for chlamydia or gonorrhea. Ask your health care provider if you are at risk. Ask your health care provider about whether you are at high risk for HIV. Your health care provider  may recommend a prescription medicine to help prevent HIV infection. If you choose to take medicine to prevent HIV, you should first get tested for HIV. You should then be tested every 3 months for as long as you are taking the medicine. Follow these instructions at home: Alcohol use Do not drink alcohol if your health care provider tells you not to drink. If you drink alcohol: Limit how much you have to 0-2 drinks a day. Know how much alcohol is in your drink. In the [place] one drink equals one 12 oz bottle of beer (355 mL), one 5 oz glass of wine (148 mL), or one 1 oz glass of hard liquor (44 mL). Lifestyle Do not use any products that contain nicotine or tobacco. These products include cigarettes, chewing tobacco, and vaping devices, such as e-cigarettes. If you need help quitting, ask your health care provider. Do not use street drugs. Do not share needles. Ask your health care provider for help if you need support or information about quitting drugs. General instructions Schedule regular health, dental, and eye exams. Stay current with your vaccines. Tell your health care provider if: You often feel depressed. You have ever been abused or do not feel safe at home. Summary Adopting a healthy lifestyle and getting preventive care are important in promoting health and wellness. Follow your health care provider's instructions about healthy diet, exercising, and getting tested or screened for diseases. Follow your health care provider's instructions on monitoring your cholesterol and blood pressure. This information is not intended to replace advice given to you by your health care provider. Make sure you discuss any questions you have with your health care provider. Document Revised: 06/26/2020 Document Reviewed: 06/26/2020 Elsevier Patient Education  2023 [employer]vier Inc.  

## 2021-09-24 NOTE — Progress Notes (Signed)
Subjective:  Patient ID: Jerry Martin, male    DOB: 23-Jun-1955  Age: 66 y.o. MRN: [id number]  CC: Annual Exam and Hyperlipidemia   HPI Jerry Martin presents for a CPX and f/up -  He is active and denies chest pain, shortness of breath, diaphoresis, dizziness, lightheadedness, or near syncope.  Outpatient Medications Prior to Visit  Medication Sig Dispense Refill   latanoprost (XALATAN) 0.005 % ophthalmic solution 1 drop at bedtime.     ciprofloxacin (CIPRO) 500 MG tablet Take 500 mg by mouth 2 (two) times daily. (Patient not taking: Reported on 09/24/2021)     metroNIDAZOLE (FLAGYL PO) Take by mouth. (Patient not taking: Reported on 09/24/2021)     zolpidem (AMBIEN) 10 MG tablet Take 1 tablet (10 mg total) by mouth at bedtime as needed for sleep. 15 tablet 1   No facility-administered medications prior to visit.    ROS Review of Systems  Constitutional: Negative.  Negative for diaphoresis and fatigue.  HENT: Negative.    Eyes: Negative.   Respiratory: Negative.  Negative for cough, chest tightness, shortness of breath and wheezing.   Cardiovascular:  Negative for chest pain, palpitations and leg swelling.  Gastrointestinal:  Negative for abdominal pain, constipation, diarrhea and nausea.  Endocrine: Negative.   Genitourinary: Negative.  Negative for difficulty urinating.  Musculoskeletal: Negative.   Skin: Negative.   Allergic/Immunologic: Negative.   Neurological: Negative.  Negative for dizziness and weakness.  Hematological:  Negative for adenopathy. Does not bruise/bleed easily.  Psychiatric/Behavioral: Negative.      Objective:  BP 118/72 (BP Location: Left Arm, Patient Position: Sitting, Cuff Size: Large)   Pulse (!) 51   Temp 98.1 F (36.7 C) (Oral)   Resp 16   Ht 6\' 1"  (1.854 m)   Wt 219 lb (99.3 kg)   SpO2 94%   BMI 28.89 kg/m   BP Readings from Last 3 Encounters:  09/24/21 118/72  07/26/20 134/86  03/24/19 116/76    Wt Readings from Last 3 Encounters:   09/24/21 219 lb (99.3 kg)  07/26/20 215 lb (97.5 kg)  03/24/19 218 lb (98.9 kg)    Physical Exam Vitals reviewed.  HENT:     Nose: Nose normal.     Mouth/Throat:     Mouth: Mucous membranes are moist.  Eyes:     General: No scleral icterus.    Conjunctiva/sclera: Conjunctivae normal.  Cardiovascular:     Rate and Rhythm: Regular rhythm. Bradycardia present.     Heart sounds: No murmur heard.    Comments: EKG- SB with 1st degree AV block RBBB - new No Q waves or LVH Pulmonary:     Effort: Pulmonary effort is normal.     Breath sounds: No stridor. No wheezing, rhonchi or rales.  Abdominal:     General: Abdomen is flat.     Palpations: There is no mass.     Tenderness: There is no abdominal tenderness. There is no guarding.     Hernia: No hernia is present. There is no hernia in the left inguinal area or right inguinal area.  Genitourinary:    Pubic Area: No rash.      Penis: Normal and circumcised.      Testes: Normal.     Epididymis:     Right: Normal.     Left: Normal.     Prostate: Enlarged. Not tender and no nodules present.     Rectum: Normal. Guaiac result negative. No mass, tenderness, anal fissure, external hemorrhoid or internal  hemorrhoid. Normal anal tone.  Musculoskeletal:        General: Normal range of motion.     Cervical back: Neck supple.     Right lower leg: No edema.     Left lower leg: No edema.  Lymphadenopathy:     Cervical: No cervical adenopathy.     Lower Body: No right inguinal adenopathy. No left inguinal adenopathy.  Skin:    General: Skin is warm and dry.  Neurological:     General: No focal deficit present.     Mental Status: He is alert.  Psychiatric:        Mood and Affect: Mood normal.     Lab Results  Component Value Date   WBC 5.6 09/24/2021   HGB 14.7 09/24/2021   HCT 43.5 09/24/2021   PLT 255.0 09/24/2021   GLUCOSE 93 09/24/2021   CHOL 231 (H) 09/24/2021   TRIG 168.0 (H) 09/24/2021   HDL 42.00 09/24/2021   LDLCALC  155 (H) 09/24/2021   ALT 25 09/24/2021   AST 19 09/24/2021   NA 142 09/24/2021   K 4.5 09/24/2021   CL 103 09/24/2021   CREATININE 1.16 09/24/2021   BUN 18 09/24/2021   CO2 26 09/24/2021   TSH 1.82 09/24/2021   PSA 0.31 09/24/2021    MR BRAIN W WO CONTRAST  Result Date: 09/06/2020 CLINICAL DATA:  Afferent pupillary defect. Side not stipulated. Open angle, high risk left. EXAM: MRI HEAD AND ORBITS WITHOUT AND WITH CONTRAST TECHNIQUE: Multiplanar, multiecho pulse sequences of the brain and surrounding structures were obtained without and with intravenous contrast. Multiplanar, multiecho pulse sequences of the orbits and surrounding structures were obtained including fat saturation techniques, before and after intravenous contrast administration. CONTRAST:  35mL MULTIHANCE GADOBENATE DIMEGLUMINE 529 MG/ML IV SOLN COMPARISON:  None. FINDINGS: MRI HEAD FINDINGS Brain: Normal appearance without evidence of malformation, atrophy, old or acute infarction, mass lesion, hemorrhage, hydrocephalus or extra-axial collection. No lesion seen affecting the tectum or optic pathway regions. Optic chiasm region appears normal. After contrast administration, no abnormal enhancement occurs. Vascular: Major vessels at the base of the brain show flow. Skull and upper cervical spine: Negative Other: None MRI ORBITS FINDINGS Orbits: Both globes appear normal. No retinal abnormality is seen. Both optic nerves appear symmetric and normal. Optic nerve sheaths are normal. No optic nerve enhancement or abnormal T2 signal. Orbital apices are normal. Optic chiasm is normal. Visualized optic tract regions are normal. Tectum is normal. Orbital fat and extraocular muscles are normal. Lacrimal glands are normal. No abnormal regional enhancement. Visualized sinuses: Clear except for small amount of fluid in the right division of the sphenoid sinus, not significant Soft tissues: Regional soft tissues are otherwise normal. IMPRESSION:  Normal MRI of the brain. Normal MRI of the orbits. No lesion seen to explain afferent pupillary defect. Electronically Signed   By: Nelson Chimes M.D.   On: 09/06/2020 09:22   MR ORBITS W WO CONTRAST  Result Date: 09/06/2020 CLINICAL DATA:  Afferent pupillary defect. Side not stipulated. Open angle, high risk left. EXAM: MRI HEAD AND ORBITS WITHOUT AND WITH CONTRAST TECHNIQUE: Multiplanar, multiecho pulse sequences of the brain and surrounding structures were obtained without and with intravenous contrast. Multiplanar, multiecho pulse sequences of the orbits and surrounding structures were obtained including fat saturation techniques, before and after intravenous contrast administration. CONTRAST:  21mL MULTIHANCE GADOBENATE DIMEGLUMINE 529 MG/ML IV SOLN COMPARISON:  None. FINDINGS: MRI HEAD FINDINGS Brain: Normal appearance without evidence of malformation, atrophy, old  or acute infarction, mass lesion, hemorrhage, hydrocephalus or extra-axial collection. No lesion seen affecting the tectum or optic pathway regions. Optic chiasm region appears normal. After contrast administration, no abnormal enhancement occurs. Vascular: Major vessels at the base of the brain show flow. Skull and upper cervical spine: Negative Other: None MRI ORBITS FINDINGS Orbits: Both globes appear normal. No retinal abnormality is seen. Both optic nerves appear symmetric and normal. Optic nerve sheaths are normal. No optic nerve enhancement or abnormal T2 signal. Orbital apices are normal. Optic chiasm is normal. Visualized optic tract regions are normal. Tectum is normal. Orbital fat and extraocular muscles are normal. Lacrimal glands are normal. No abnormal regional enhancement. Visualized sinuses: Clear except for small amount of fluid in the right division of the sphenoid sinus, not significant Soft tissues: Regional soft tissues are otherwise normal. IMPRESSION: Normal MRI of the brain. Normal MRI of the orbits. No lesion seen to  explain afferent pupillary defect. Electronically Signed   By: Paulina Fusi M.D.   On: 09/06/2020 09:22    Assessment & Plan:   Jerry Martin was seen today for annual exam and hyperlipidemia.  Diagnoses and all orders for this visit:  Primary hypertension- His BP is well controlled. -     CBC with Differential/Platelet; Future -     TSH; Future -     Urinalysis, Routine w reflex microscopic; Future -     Hepatic function panel; Future -     Basic metabolic panel; Future -     Basic metabolic panel -     Hepatic function panel -     Urinalysis, Routine w reflex microscopic -     TSH -     CBC with Differential/Platelet  Benign prostatic hyperplasia without lower urinary tract symptoms- PSa is normal. No sx's. -     PSA; Future -     Urinalysis, Routine w reflex microscopic; Future -     Urinalysis, Routine w reflex microscopic -     PSA  Hyperlipidemia with target LDL less than 130- Will start a statin for CV risk reduction. -     Lipid panel; Future -     TSH; Future -     Hepatic function panel; Future -     Hepatic function panel -     TSH -     Lipid panel -     rosuvastatin (CRESTOR) 20 MG tablet; Take 1 tablet (20 mg total) by mouth daily.  Routine general medical examination at a health care facility-  Exam completed, labs reviewed, vaccines are UTD, cancer screenings are UTD, pt ed was given.  Chronic sinus bradycardia  New onset right bundle branch block (RBBB) -     Ambulatory referral to Cardiology  Bradycardia -     EKG 12-Lead -     Ambulatory referral to Cardiology   I am having Jerry Martin start on rosuvastatin. I am also having him maintain his zolpidem, ciprofloxacin, metroNIDAZOLE (FLAGYL PO), and latanoprost.  Meds ordered this encounter  Medications   rosuvastatin (CRESTOR) 20 MG tablet    Sig: Take 1 tablet (20 mg total) by mouth daily.    Dispense:  90 tablet    Refill:  3     Follow-up: Return in about 6 months (around 03/27/2022).  Sanda Linger, MD

## 2021-09-28 ENCOUNTER — Encounter: Payer: Self-pay | Admitting: Internal Medicine

## 2021-09-28 MED ORDER — SHINGRIX 50 MCG/0.5ML IM SUSR: 0.5000 mL | Freq: Once | INTRAMUSCULAR | 1 refills | Status: AC

## 2021-09-28 NOTE — Addendum Note (Signed)
Addended by: Etta Grandchild on: 09/28/2021 10:37 AM   Modules accepted: Orders

## 2021-10-25 ENCOUNTER — Encounter: Payer: Self-pay | Admitting: Cardiovascular Disease

## 2021-10-25 ENCOUNTER — Ambulatory Visit: Payer: PPO | Attending: Cardiovascular Disease | Admitting: Cardiovascular Disease

## 2021-10-25 VITALS — BP 122/78 | HR 57 | Ht 73.0 in | Wt 219.0 lb

## 2021-10-25 DIAGNOSIS — R9431 Abnormal electrocardiogram [ECG] [EKG]: Secondary | ICD-10-CM | POA: Diagnosis not present

## 2021-10-25 DIAGNOSIS — E782 Mixed hyperlipidemia: Secondary | ICD-10-CM | POA: Diagnosis not present

## 2021-10-25 DIAGNOSIS — Z01810 Encounter for preprocedural cardiovascular examination: Secondary | ICD-10-CM | POA: Diagnosis not present

## 2021-10-25 DIAGNOSIS — I4510 Unspecified right bundle-branch block: Secondary | ICD-10-CM | POA: Diagnosis not present

## 2021-10-25 LAB — BASIC METABOLIC PANEL
BUN/Creatinine Ratio: 14 (ref 10–24)
BUN: 15 mg/dL (ref 8–27)
CO2: 24 mmol/L (ref 20–29)
Calcium: 9.4 mg/dL (ref 8.6–10.2)
Chloride: 103 mmol/L (ref 96–106)
Creatinine, Ser: 1.08 mg/dL (ref 0.76–1.27)
Glucose: 87 mg/dL (ref 70–99)
Potassium: 4.2 mmol/L (ref 3.5–5.2)
Sodium: 140 mmol/L (ref 134–144)
eGFR: 75 mL/min/{1.73_m2} (ref >59)

## 2021-10-25 NOTE — Patient Instructions (Addendum)
Medication Instructions:  Your physician recommends that you continue on your current medications as directed. Please refer to the Current Medication list given to you today.  *If you need a refill on your cardiac medications before your next appointment, please call your pharmacy*   Lab Work: BMET today If you have labs (blood work) drawn today and your tests are completely normal, you will receive your results only by: MyChart Message (if you have MyChart) OR A paper copy in the mail If you have any lab test that is abnormal or we need to change your treatment, we will call you to review the results.  Testing/Procedures: ECHO Your physician has requested that you have an echocardiogram. Echocardiography is a painless test that uses sound waves to create images of your heart. It provides your doctor with information about the size and shape of your heart and how well your heart's chambers and valves are working. This procedure takes approximately one hour. There are no restrictions for this procedure.  Coronary CT Angiogram Your physician has requested that you have cardiac CT. Cardiac computed tomography (CT) is a painless test that uses an x-ray machine to take clear, detailed pictures of your heart. For further information please visit [email or web address] Please follow instruction sheet as given.  Follow-Up: At [facility], you and your health needs are our priority.  As part of our continuing mission to provide you with exceptional heart care, we have created designated Provider Care Teams.  These Care Teams include your primary Cardiologist (physician) and Advanced Practice Providers (APPs -  Physician Assistants and Nurse Practitioners) who all work together to provide you with the care you need, when you need it.  Your next appointment:   1 year(s)  The format for your next appointment:   In Person  Provider:   Kristeen Miss, MD  Other Instructions   Your cardiac  CT will be scheduled at:   [facility] [address] [place], [place] [address] [phone]  please arrive at the [facility] (Entrance C2) of [facility] 30 minutes prior to test start time. You can use the FREE valet parking offered at entrance C (encouraged to control the heart rate for the test)  Proceed to the [facility] Radiology Department (first floor) to check-in and test prep.  All radiology patients and guests should use entrance C2 at [facility], accessed from [facility], even though the hospital's physical address listed is [address]    Please follow these instructions carefully (unless otherwise directed):  Hold all erectile dysfunction medications at least 3 days (72 hrs) prior to test.  On the Night Before the Test: Be sure to Drink plenty of water. Do not consume any caffeinated/decaffeinated beverages or chocolate 12 hours prior to your test. Do not take any antihistamines 12 hours prior to your test.  On the Day of the Test: Drink plenty of water until 1 hour prior to the test. Do not eat any food 4 hours prior to the test. You may take your regular medications prior to the test.   After the Test: Drink plenty of water. After receiving IV contrast, you may experience a mild flushed feeling. This is normal. On occasion, you may experience a mild rash up to 24 hours after the test. This is not dangerous. If this occurs, you can take Benadryl 25 mg and increase your fluid intake. If you experience trouble breathing, this can be serious.  If it is severe call 911 IMMEDIATELY. If it is mild, please call our office. If you take any of these medications: Glipizide/Metformin, Avandament, Glucavance, please do not take 48 hours after completing test unless otherwise instructed.  We will call to schedule your test 2-4 weeks out understanding that some insurance companies will need an  authorization prior to the service being performed.   For non-scheduling related questions, please contact the cardiac imaging nurse navigator should you have any questions/concerns: Rockwell Alexandria, Cardiac Imaging Nurse Navigator Larey Brick, Cardiac Imaging Nurse Navigator [facility] Direct Office Dial: [phone]   For scheduling needs, including cancellations and rescheduling, please call [place], [phone].   Important Information About Sugar

## 2021-10-25 NOTE — Progress Notes (Signed)
Cardiology Office Note:    Date:  10/25/2021   ID:  Jerry Martin, DOB [birth year], MRN [id number]  PCP:  Etta Grandchild, MD   [facility] Providers Cardiologist:  Kylar Leonhardt    Referring MD: Etta Grandchild, MD   Chief Complaint  Patient presents with   Abnormal ECG    History of Present Illness:    Jerry Martin is a 66 y.o. male with a hx of hyperlipidemia, hypertension and a new diagnosis of right bundle branch block.  Jerry Martin is very healthy.  He exercises routinely.  He never has any episodes of chest pain or shortness of breath He walks, bikes, works out on the [employer]  Has a history of hyperlipidemia.  His last LDL was 155.  He started rosuvastatin 20 mg a day recently.  His blood pressure used to be a little bit higher.  He is made a conscious effort to improve his diet and his blood Pressure has been generally lower over the past couple of years.  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   rosuvastatin (CRESTOR) 20 MG tablet Take 1 tablet (20 mg total) by mouth daily.     Allergies:   Penicillins and Irbesartan   Social History   Socioeconomic History   Marital status: Married    Spouse name: Not on file   Number of children: Not on file   Years of education: Not on file   Highest education level: Not on file  Occupational History   Not on file  Tobacco Use   Smoking status: Some Days    Types: Cigars    Passive exposure: Never   Smokeless tobacco: Never  Vaping Use   Vaping Use: Never used   Substance and Sexual Activity   Alcohol use: Not Currently    Alcohol/week: 0.0 standard drinks of alcohol   Drug use: Yes    Types: Marijuana   Sexual activity: Yes    Partners: Female  Other Topics Concern   Not on file  Social History Narrative   Not on file   Social Determinants of Health   Financial Resource Strain: Not on file  Food Insecurity: Not on file  Transportation Needs: Not on file  Physical Activity: Not on file  Stress: Not on file  Social Connections: Not on file     Family History: The patient's family history includes Hypertension in his mother.  ROS:   Please see the history of present illness.     All other systems reviewed and are negative.  EKGs/Labs/Other Studies Reviewed:    The following studies were reviewed today:   EKG: September 24, 2021: Sinus bradycardia at 48.  First-degree AV block with a PR interval of 220 ms.  Right bundle branch block.  The right bundle branch block is new from last year.  Recent Labs: 09/24/2021: ALT 25; BUN 18; Creatinine, Ser 1.16; Hemoglobin 14.7; Platelets 255.0; Potassium 4.5; Sodium 142; TSH 1.82  Recent Lipid Panel    Component Value Date/Time   CHOL 231 (H) 09/24/2021 1040   TRIG 168.0 (H) 09/24/2021 1040   HDL 42.00 09/24/2021 1040   CHOLHDL 5 09/24/2021 1040   VLDL 33.6 09/24/2021 1040   LDLCALC 155 (H) 09/24/2021 1040     Risk Assessment/Calculations:                Physical Exam:    VS:  BP 122/78   Pulse (!) 57   Ht 6\' 1"  (1.854 m)   Wt 219 lb (99.3 kg)   SpO2 98%   BMI 28.89 kg/m     Wt Readings from Last 3 Encounters:  10/25/21 219 lb (99.3 kg)  09/24/21 219 lb (99.3 kg)  07/26/20 215 lb (97.5 kg)     GEN:  Well nourished, well developed in no acute distress HEENT: Normal NECK: No JVD; No carotid bruits LYMPHATICS: No lymphadenopathy CARDIAC: RRR, no murmurs, rubs, gallops RESPIRATORY:  Clear to auscultation without rales, wheezing or rhonchi  ABDOMEN: Soft, non-tender,  non-distended MUSCULOSKELETAL:  No edema; No deformity  SKIN: Warm and dry NEUROLOGIC:  Alert and oriented x 3 PSYCHIATRIC:  Normal affect   ASSESSMENT:    1. Abnormal electrocardiogram   2. RBBB (right bundle branch block)   3. Mixed hyperlipidemia   4. Pre-procedural cardiovascular examination    PLAN:    In order of problems listed above:  Right bundle branch block: The patient presents with a new right bundle branch block.  He has a long history of first-degree AV block and sinus bradycardia. We will get an echocardiogram for further evaluation of his cardiac function and valvular function.  We will also get a coronary CT angiogram to evaluate him for the possibility of ischemia given his new right bundle branch block.  We will consider an exercise Myoview study plus a coronary calcium score if he is not able to do the coronary CT angiogram.   2.  Hyperlipidemia: His last LDL from September 24, 2021 was 155.  He needs a coronary calcium score.  My hope is that we can get this as part of a coronary CT angiogram.  He was just started on rosuvastatin.          Medication Adjustments/Labs and Tests Ordered: Current medicines are reviewed at length with the patient today.  Concerns regarding medicines are outlined above.  Orders Placed This Encounter  Procedures   CT CORONARY MORPH W/CTA COR W/SCORE W/CA W/CM &/OR WO/CM   Basic metabolic panel   ECHOCARDIOGRAM COMPLETE   No orders of the defined types were placed in this encounter.    Patient Instructions  Medication Instructions:  Your physician recommends that you continue on your current medications as directed. Please refer to the Current Medication list given to you today.  *If you need a refill on your cardiac medications before your next appointment, please call your pharmacy*   Lab Work: BMET today If you have labs (blood work) drawn today and your tests are completely normal, you will receive your results only  by: MyChart Message (if you have MyChart) OR A paper copy in the mail If you have any lab test that is abnormal or we need to change your treatment, we will call you to review the results.  Testing/Procedures: ECHO Your physician has requested that you have an echocardiogram. Echocardiography is a painless test that uses sound waves to create images of your heart. It provides your doctor with information about  the size and shape of your heart and how well your heart's chambers and valves are working. This procedure takes approximately one hour. There are no restrictions for this procedure.  Coronary CT Angiogram Your physician has requested that you have cardiac CT. Cardiac computed tomography (CT) is a painless test that uses an x-ray machine to take clear, detailed pictures of your heart. For further information please visit [email or web address] Please follow instruction sheet as given.  Follow-Up: At [facility], you and your health needs are our priority.  As part of our continuing mission to provide you with exceptional heart care, we have created designated Provider Care Teams.  These Care Teams include your primary Cardiologist (physician) and Advanced Practice Providers (APPs -  Physician Assistants and Nurse Practitioners) who all work together to provide you with the care you need, when you need it.  Your next appointment:   1 year(s)  The format for your next appointment:   In Person  Provider:   Kristeen Miss, MD  Other Instructions   Your cardiac CT will be scheduled at:   [facility] [address] [place], [place] [address] [phone]  please arrive at the [facility] En[facility] (Entrance C2) of [facility] 30 minutes prior to test start time. You can use the FREE valet parking offered at entrance C (encouraged to control the heart rate for the test)  Proceed to the [facility] Radiology Department (first floor) to check-in  and test prep.  All radiology patients and guests should use entrance C2 at [facility], accessed from [facility], even though the hospital's physical address listed is [address]    Please follow these instructions carefully (unless otherwise directed):  Hold all erectile dysfunction medications at least 3 days (72 hrs) prior to test.  On the Night Before the Test: Be sure to Drink plenty of water. Do not consume any caffeinated/decaffeinated beverages or chocolate 12 hours prior to your test. Do not take any antihistamines 12 hours prior to your test.  On the Day of the Test: Drink plenty of water until 1 hour prior to the test. Do not eat any food 4 hours prior to the test. You may take your regular medications prior to the test.   After the Test: Drink plenty of water. After receiving IV contrast, you may experience a mild flushed feeling. This is normal. On occasion, you may experience a mild rash up to 24 hours after the test. This is not dangerous. If this occurs, you can take Benadryl 25 mg and increase your fluid intake. If you experience trouble breathing, this can be serious. If it is severe call 911 IMMEDIATELY. If it is mild, please call our office. If you take any of these medications: Glipizide/Metformin, Avandament, Glucavance, please do not take 48 hours after completing test unless otherwise instructed.  We will call to schedule your test 2-4 weeks out understanding that some insurance companies will need an authorization prior to the service being performed.   For non-scheduling related questions, please contact the cardiac imaging nurse navigator should you have any questions/concerns: Rockwell [place], Cardiac Imaging Nurse Navigator Larey Brick, Cardiac Imaging Nurse Navigator [facility] Direct Office Dial: [phone]   For scheduling needs, including cancellations and rescheduling, please call  [place], [phone].   Important Information About Sugar         Signed, Kristeen Miss, MD  10/25/2021 6:44 PM  [place]

## 2021-11-12 ENCOUNTER — Ambulatory Visit (HOSPITAL_COMMUNITY): Payer: PPO | Attending: Cardiovascular Disease

## 2021-11-12 DIAGNOSIS — I4510 Unspecified right bundle-branch block: Secondary | ICD-10-CM | POA: Diagnosis not present

## 2021-11-12 DIAGNOSIS — R9431 Abnormal electrocardiogram [ECG] [EKG]: Secondary | ICD-10-CM | POA: Insufficient documentation

## 2021-11-12 DIAGNOSIS — E782 Mixed hyperlipidemia: Secondary | ICD-10-CM | POA: Diagnosis not present

## 2021-11-12 LAB — ECHOCARDIOGRAM COMPLETE
Area-P 1/2: 2.63 cm2
P 1/2 time: 1066 msec
S' Lateral: 2.9 cm

## 2021-11-14 ENCOUNTER — Telehealth: Payer: Self-pay

## 2021-11-14 DIAGNOSIS — Z01810 Encounter for preprocedural cardiovascular examination: Secondary | ICD-10-CM

## 2021-11-14 NOTE — Telephone Encounter (Signed)
Called and spoke with patient who understands the need for TEE and agrees to plan. TEE has been scheduled for Friday, 12/21/21. Pt will have labs done here on 12/14/21. Order placed at this time. Instructions sent to patient via MyChart.

## 2021-11-14 NOTE — Telephone Encounter (Signed)
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 Gasper Sells. Anterior MV prolapse - no significant MR

## 2021-11-19 ENCOUNTER — Other Ambulatory Visit: Payer: Self-pay | Admitting: Cardiovascular Disease

## 2021-11-19 DIAGNOSIS — I358 Other nonrheumatic aortic valve disorders: Secondary | ICD-10-CM

## 2021-11-19 NOTE — Progress Notes (Signed)
Pt was found to have a small mass on the leaflets of the aortic valve. 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

## 2021-11-30 ENCOUNTER — Telehealth (HOSPITAL_COMMUNITY): Payer: Self-pay | Admitting: *Deleted

## 2021-11-30 NOTE — Telephone Encounter (Signed)
Attempted to call patient regarding upcoming cardiac CT appointment. °Left message on voicemail with name and callback number ° °Felicite Zeimet RN Navigator Cardiac Imaging °[facility] °[phone] Office °[phone] Cell ° °

## 2021-12-03 ENCOUNTER — Ambulatory Visit (HOSPITAL_COMMUNITY)
Admission: RE | Admit: 2021-12-03 | Discharge: 2021-12-03 | Disposition: A | Discharge status: Home / Self Care | Payer: PPO | Source: Ambulatory Visit | Attending: Cardiovascular Disease | Admitting: Cardiovascular Disease

## 2021-12-03 DIAGNOSIS — I2510 Atherosclerotic heart disease of native coronary artery without angina pectoris: Secondary | ICD-10-CM | POA: Diagnosis not present

## 2021-12-03 DIAGNOSIS — E782 Mixed hyperlipidemia: Secondary | ICD-10-CM | POA: Diagnosis not present

## 2021-12-03 DIAGNOSIS — I4510 Unspecified right bundle-branch block: Secondary | ICD-10-CM | POA: Diagnosis not present

## 2021-12-03 DIAGNOSIS — R9431 Abnormal electrocardiogram [ECG] [EKG]: Secondary | ICD-10-CM | POA: Diagnosis not present

## 2021-12-03 MED ORDER — NITROGLYCERIN 0.4 MG SL SUBL
0.8000 mg | SUBLINGUAL_TABLET | Freq: Once | SUBLINGUAL | Status: AC
  Administered 2021-12-03: 0.8 mg via SUBLINGUAL

## 2021-12-03 MED ORDER — NITROGLYCERIN 0.4 MG SL SUBL
SUBLINGUAL_TABLET | SUBLINGUAL | Status: AC
  Filled 2021-12-03: qty 2

## 2021-12-03 MED ORDER — IOHEXOL 350 MG/ML SOLN
95.0000 mL | Freq: Once | INTRAVENOUS | Status: AC | PRN
  Administered 2021-12-03: 95 mL via INTRAVENOUS

## 2021-12-10 ENCOUNTER — Telehealth: Payer: Self-pay

## 2021-12-10 DIAGNOSIS — Z79899 Other long term (current) drug therapy: Secondary | ICD-10-CM

## 2021-12-10 DIAGNOSIS — E782 Mixed hyperlipidemia: Secondary | ICD-10-CM

## 2021-12-10 NOTE — Telephone Encounter (Signed)
-----   Message from Thayer Headings, MD sent at 12/04/2021  1:21 PM EDT ----- Coronary artery calcium score 201 Agatston units. This places the patient in the 60th percentile for age and gender, suggesting  Mild - moderate CAD .   FFR does not show hemodynamically significant stenosis  His LDL goal is now 55 ( because of the presence of CAD )  His las LDL is 155  He was started on rosuvastatin in Aug. Please repeat Lipids, ALT If he is not at goal, I would have a low threshold to refer him to the lipid clinic for consideration of PCSK9 inhibitor    Moderate to large hiatal hernia. This may be the cause of some of his symptoms

## 2021-12-10 NOTE — Telephone Encounter (Signed)
Spoke with patient who understands the need for repeat labs. He is coming in for blood work on 12/14/21 for TEE and will obtain these as well. Will follow with PCP regarding hernia.

## 2021-12-14 ENCOUNTER — Ambulatory Visit: Payer: PPO | Attending: Cardiovascular Disease

## 2021-12-14 DIAGNOSIS — E782 Mixed hyperlipidemia: Secondary | ICD-10-CM | POA: Diagnosis not present

## 2021-12-14 DIAGNOSIS — Z79899 Other long term (current) drug therapy: Secondary | ICD-10-CM | POA: Diagnosis not present

## 2021-12-14 DIAGNOSIS — Z01810 Encounter for preprocedural cardiovascular examination: Secondary | ICD-10-CM | POA: Diagnosis not present

## 2021-12-15 LAB — BASIC METABOLIC PANEL
BUN/Creatinine Ratio: 16 (ref 10–24)
BUN: 17 mg/dL (ref 8–27)
CO2: 25 mmol/L (ref 20–29)
Calcium: 9.4 mg/dL (ref 8.6–10.2)
Chloride: 102 mmol/L (ref 96–106)
Creatinine, Ser: 1.05 mg/dL (ref 0.76–1.27)
Glucose: 72 mg/dL (ref 70–99)
Potassium: 4.2 mmol/L (ref 3.5–5.2)
Sodium: 139 mmol/L (ref 134–144)
eGFR: 77 mL/min/{1.73_m2} (ref >59)

## 2021-12-15 LAB — CBC
Hematocrit: 43.4 % (ref 37.5–51.0)
Hemoglobin: 14.6 g/dL (ref 13.0–17.7)
MCH: 30.9 pg (ref 26.6–33.0)
MCHC: 33.6 g/dL (ref 31.5–35.7)
MCV: 92 fL (ref 79–97)
Platelets: 269 10*3/uL (ref 150–450)
RBC: 4.72 x10E6/uL (ref 4.14–5.80)
RDW: 12.6 % (ref 11.6–15.4)
WBC: 5.8 10*3/uL (ref 3.4–10.8)

## 2021-12-15 LAB — LIPID PANEL
Chol/HDL Ratio: 3.3 ratio (ref 0.0–5.0)
Cholesterol, Total: 160 mg/dL (ref 100–199)
HDL: 49 mg/dL (ref >39)
LDL Chol Calc (NIH): 88 mg/dL (ref 0–99)
Triglycerides: 132 mg/dL (ref 0–149)
VLDL Cholesterol Cal: 23 mg/dL (ref 5–40)

## 2021-12-15 LAB — ALT: ALT: 40 IU/L (ref 0–44)

## 2021-12-17 ENCOUNTER — Encounter: Payer: Self-pay | Admitting: Cardiovascular Disease

## 2021-12-17 ENCOUNTER — Telehealth: Payer: Self-pay

## 2021-12-17 DIAGNOSIS — E782 Mixed hyperlipidemia: Secondary | ICD-10-CM

## 2021-12-17 DIAGNOSIS — Z79899 Other long term (current) drug therapy: Secondary | ICD-10-CM

## 2021-12-17 MED ORDER — EZETIMIBE 10 MG PO TABS: 10.0000 mg | ORAL_TABLET | Freq: Every day | ORAL | 3 refills | Status: DC

## 2021-12-17 NOTE — Telephone Encounter (Signed)
-----   Message from Thayer Headings, MD sent at 12/15/2021  7:50 AM EDT ----- Labs are ok for TEE . CBC, BMP ae WNL LDL is 88 and  elevated - his LDL goal is 55 given his CAD   Add zetia 10 mg a day  Recheck lipids in 3 months ,  continue diet, exercise , weight loss

## 2021-12-17 NOTE — Telephone Encounter (Signed)
Left details per DPR and asked pt to call or [place] office back. Received following response below:  Michaelyn Barter, RN routed conversation to You 2 hours ago (2:24 PM)   Shirley Triage (supporting Thayer Headings, MD) 3 hours ago (1:55 PM)   [facility] I received Jerry Martin's follow up on the latest blood work.  Good to go on zetia - please submit prescription to [facility] [address]. Thank you Torrie Lafavor ordered at this time and repeat labs entered and scheduled for 03/19/22.

## 2021-12-21 ENCOUNTER — Encounter (HOSPITAL_COMMUNITY): Payer: Self-pay | Admitting: Internal Medicine

## 2021-12-21 ENCOUNTER — Ambulatory Visit (HOSPITAL_BASED_OUTPATIENT_CLINIC_OR_DEPARTMENT_OTHER): Payer: PPO | Admitting: Anesthesiology

## 2021-12-21 ENCOUNTER — Other Ambulatory Visit: Payer: Self-pay

## 2021-12-21 ENCOUNTER — Ambulatory Visit (HOSPITAL_COMMUNITY): Payer: PPO | Admitting: Anesthesiology

## 2021-12-21 ENCOUNTER — Encounter (HOSPITAL_COMMUNITY)
Admission: RE | Disposition: A | Discharge status: Home / Self Care | Payer: Self-pay | Source: Home / Self Care | Attending: Internal Medicine

## 2021-12-21 ENCOUNTER — Ambulatory Visit (HOSPITAL_BASED_OUTPATIENT_CLINIC_OR_DEPARTMENT_OTHER)
Admission: RE | Admit: 2021-12-21 | Discharge: 2021-12-21 | Disposition: A | Discharge status: Home / Self Care | Payer: PPO | Source: Ambulatory Visit | Attending: Cardiovascular Disease | Admitting: Cardiovascular Disease

## 2021-12-21 ENCOUNTER — Ambulatory Visit (HOSPITAL_COMMUNITY)
Admission: RE | Admit: 2021-12-21 | Discharge: 2021-12-21 | Disposition: A | Discharge status: Home / Self Care | Payer: PPO | Attending: Internal Medicine | Admitting: Internal Medicine

## 2021-12-21 DIAGNOSIS — I4510 Unspecified right bundle-branch block: Secondary | ICD-10-CM | POA: Diagnosis not present

## 2021-12-21 DIAGNOSIS — I10 Essential (primary) hypertension: Secondary | ICD-10-CM | POA: Insufficient documentation

## 2021-12-21 DIAGNOSIS — E785 Hyperlipidemia, unspecified: Secondary | ICD-10-CM | POA: Diagnosis not present

## 2021-12-21 DIAGNOSIS — F17200 Nicotine dependence, unspecified, uncomplicated: Secondary | ICD-10-CM

## 2021-12-21 DIAGNOSIS — I351 Nonrheumatic aortic (valve) insufficiency: Secondary | ICD-10-CM | POA: Insufficient documentation

## 2021-12-21 DIAGNOSIS — I350 Nonrheumatic aortic (valve) stenosis: Secondary | ICD-10-CM

## 2021-12-21 DIAGNOSIS — I358 Other nonrheumatic aortic valve disorders: Secondary | ICD-10-CM

## 2021-12-21 DIAGNOSIS — F17290 Nicotine dependence, other tobacco product, uncomplicated: Secondary | ICD-10-CM | POA: Diagnosis not present

## 2021-12-21 DIAGNOSIS — Q2112 Patent foramen ovale: Secondary | ICD-10-CM | POA: Insufficient documentation

## 2021-12-21 HISTORY — PX: TEE WITHOUT CARDIOVERSION: SHX5443

## 2021-12-21 HISTORY — PX: BUBBLE STUDY: SHX6837

## 2021-12-21 SURGERY — ECHOCARDIOGRAM, TRANSESOPHAGEAL
Anesthesia: Monitor Anesthesia Care

## 2021-12-21 MED ORDER — PROPOFOL 500 MG/50ML IV EMUL
INTRAVENOUS | Status: DC | PRN
  Administered 2021-12-21: 175 ug/kg/min via INTRAVENOUS

## 2021-12-21 MED ORDER — PHENYLEPHRINE 80 MCG/ML (10ML) SYRINGE FOR IV PUSH (FOR BLOOD PRESSURE SUPPORT)
PREFILLED_SYRINGE | INTRAVENOUS | Status: DC | PRN
  Administered 2021-12-21 (×2): 80 ug via INTRAVENOUS

## 2021-12-21 MED ORDER — BUTAMBEN-TETRACAINE-BENZOCAINE 2-2-14 % EX AERO
INHALATION_SPRAY | CUTANEOUS | Status: DC | PRN
  Administered 2021-12-21: 2 via TOPICAL

## 2021-12-21 MED ORDER — PROPOFOL 10 MG/ML IV BOLUS
INTRAVENOUS | Status: DC | PRN
  Administered 2021-12-21 (×2): 20 mg via INTRAVENOUS
  Administered 2021-12-21: 40 mg via INTRAVENOUS
  Administered 2021-12-21: 20 mg via INTRAVENOUS

## 2021-12-21 MED ORDER — SODIUM CHLORIDE 0.9 % IV SOLN: INTRAVENOUS | Status: DC

## 2021-12-21 NOTE — Transfer of Care (Signed)
Immediate Anesthesia Transfer of Care Note  Patient: Jerry Martin  Procedure(s) Performed: TRANSESOPHAGEAL ECHOCARDIOGRAM (TEE) BUBBLE STUDY  Patient Location: Short Stay  Anesthesia Type:MAC  Level of Consciousness: drowsy  Airway & Oxygen Therapy: Patient Spontanous Breathing  Post-op Assessment: Report given to RN and Post -op Vital signs reviewed and stable  Post vital signs: Reviewed and stable  Last Vitals:  Vitals Value Taken Time  BP 91/58 12/21/21 1027  Temp    Pulse 51 12/21/21 1028  Resp 16 12/21/21 1028  SpO2 92 % 12/21/21 1028  Vitals shown include unvalidated device data.  Last Pain:  Vitals:   12/21/21 0927  TempSrc: Temporal  PainSc: 0-No pain         Complications: No notable events documented.

## 2021-12-21 NOTE — CV Procedure (Signed)
    TRANSESOPHAGEAL ECHOCARDIOGRAM   NAME:  Jerry Martin    MRN: [id number] DOB:  1955/07/25    ADMIT DATE: 12/21/2021  INDICATIONS: PFE  PROCEDURE:   Informed consent was obtained prior to the procedure. The risks, benefits and alternatives for the procedure were discussed and the patient comprehended these risks.  Risks include, but are not limited to, cough, sore throat, vomiting, nausea, somnolence, esophageal and stomach trauma or perforation, bleeding, low blood pressure, aspiration, pneumonia, infection, trauma to the teeth and death.    Procedural time out performed. The oropharynx was anesthetized with topical 1% benzocaine.    Anesthesia was administered by Dr. Marcie Bal and team.  T The patient's heart rate, blood pressure, and oxygen saturation are monitored continuously during the procedure.  The transesophageal probe was inserted in the esophagus and stomach without difficulty and multiple views were obtained.   COMPLICATIONS:    There were no immediate complications.  KEY FINDINGS:  No PFE. Positive bubble study with PFO. Lipomatous interatrial septum. MVP with MR. Chiari network.  Full report to follow. Further management per primary team.   Rudean Haskell, MD [place]  10:40 AM

## 2021-12-21 NOTE — H&P (Addendum)
Cardiology Procedural History and Physical :   Patient ID: Jerry Martin; MRN: [id number]; DOB: 06-Jun-1955   Admission date: 12/21/2021  Primary Care Provider: Etta Grandchild, MD Primary Cardiologist: Nahser  CC: Aortic valve TEE  Patient Profile:   [name] is a 66 y.o. male with a history of HTN and HLD and RBBB Who is presenting for TEE.  History of Present Illness:   [name] is feeling fineHas had no chest pain, chest pressure, chest tightness, chest stinging .No shortness of breath, DOE .  No PND or orthopnea.  No weight gain, leg swelling , or abdominal swelling.  No syncope or near syncope . Notes  no palpitations or funny heart beats.     He was found to have a RBBB-> got an echo mild AI and PFE-> and evidence of a PFE.  TEE today to confirm. No dysphagia.  No prior TIA.   Past Medical History:  Diagnosis Date   GERD (gastroesophageal reflux disease)    Hypercholesteremia    Hypertension    RBBB     Past Surgical History:  Procedure Laterality Date   HERNIA REPAIR     UVULECTOMY       Medications Prior to Admission: Prior to Admission medications   Medication Sig Start Date End Date Taking? Authorizing Provider  ezetimibe (ZETIA) 10 MG tablet Take 1 tablet (10 mg total) by mouth daily. 12/17/21  Yes Nahser, Deloris Ping, MD  latanoprost (XALATAN) 0.005 % ophthalmic solution Place 1 drop into the left eye at bedtime.   Yes [provider]  Multiple Vitamin (MULTIVITAMIN) capsule Take 1 capsule by mouth daily.   Yes [provider]  rosuvastatin (CRESTOR) 20 MG tablet Take 1 tablet (20 mg total) by mouth daily. 09/24/21  Yes Etta Grandchild, MD     Allergies:    Allergies  Allergen Reactions   Penicillins Other (See Comments)    Childhood   Irbesartan Other (See Comments)    Light headedness    Social History:   Social History   Socioeconomic History   Marital status: Married    Spouse name: Not on file   Number of children: Not on  file   Years of education: Not on file   Highest education level: Not on file  Occupational History   Not on file  Tobacco Use   Smoking status: Some Days    Types: Cigars    Passive exposure: Never   Smokeless tobacco: Never  Vaping Use   Vaping Use: Never used  Substance and Sexual Activity   Alcohol use: Not Currently    Alcohol/week: 0.0 standard drinks of alcohol   Drug use: Yes    Types: Marijuana   Sexual activity: Yes    Partners: Female  Other Topics Concern   Not on file  Social History Narrative   Not on file   Social Determinants of Health   Financial Resource Strain: Not on file  Food Insecurity: Not on file  Transportation Needs: Not on file  Physical Activity: Not on file  Stress: Not on file  Social Connections: Not on file  Intimate Partner Violence: Not on file    Family History:   The patient's family history includes Hypertension in his mother.    ROS:  Please see the history of present illness.  All other ROS reviewed and negative.     Physical Exam/Data:   Vitals:   12/21/21 0927  BP: (!) 131/92  Pulse: Marland Kitchen)  49  Resp: 14  Temp: 97.6 F (36.4 C)  TempSrc: Temporal  SpO2: 97%  Weight: 97.5 kg  Height: 6\' 1"  (1.854 m)   No intake or output data in the 24 hours ending 12/21/21 0947 Filed Weights   12/21/21 0927  Weight: 97.5 kg   Body mass index is 28.37 kg/m.   Gen: no distress Cardiac: No Rubs or Gallops, no murmur, regular bradycardia Respiratory: Clear to auscultation bilaterally, normal effort, normal  respiratory rate GI: Soft, nontender, non-distended  MS: No  edema;  moves all extremities Integument: Skin feels warm Neuro:  At time of evaluation, alert and oriented to person/place/time/situation  Psych: Normal affect, patient feels ok  ECHO COMPLETE WO IMAGING ENHANCING AGENT 11/12/2021  Narrative ECHOCARDIOGRAM REPORT    Patient Name:   Jerry Martin    Date of Exam: 11/12/2021 Medical Rec #:  11/14/2021     Height:        73.0 in Accession #:    [id number]    Weight:       219.0 lb Date of Birth:  [birth year]     BSA:          2.236 m Patient Age:    66 years      BP:           122/78 mmHg Patient Gender: M             HR:           48 bpm. Exam Location:  [address]  Procedure: 2D Echo, Cardiac Doppler and Color Doppler  Indications:    R94.31 Abnormal EKG  History:        Patient has no prior history of Echocardiogram examinations. Abnormal ECG, Arrythmias:RBBB; Risk Factors:Hypertension and Dyslipidemia.  Sonographer:    07/10/1955 RDCS Referring Phys: [phone] PHILIP J NAHSER  IMPRESSIONS   1. 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 Aortic valve regurgitation is mild. No aortic stenosis is present. 2. Left ventricular ejection fraction, by estimation, is 60 to 65%. The left ventricle has normal function. The left ventricle has no regional wall motion abnormalities. Left ventricular diastolic parameters were normal. 3. Right ventricular systolic function is normal. The right ventricular size is normal. Tricuspid regurgitation signal is inadequate for assessing PA pressure. 4. The mitral valve is abnormal- anterior mitral valve prolapse without evidence of MR. No evidence of mitral valve regurgitation. No evidence of mitral stenosis. 5. The inferior vena cava is normal in size with greater than 50% respiratory variability, suggesting right atrial pressure of 3 mmHg.  Comparison(s): No prior Echocardiogram.  Conclusion(s)/Recommendation(s): Consider TEE better characterize PFE.  FINDINGS Left Ventricle: Left ventricular ejection fraction, by estimation, is 60 to 65%. The left ventricle has normal function. The left ventricle has no regional wall motion abnormalities. The left ventricular internal cavity size was normal in size. There is no left ventricular hypertrophy. Left  ventricular diastolic parameters were normal.  Right Ventricle: The right ventricular size is normal. No increase in right ventricular wall thickness. Right ventricular systolic function is normal. Tricuspid regurgitation signal is inadequate for assessing PA pressure.  Left Atrium: Left atrial size was normal in size.  Right Atrium: Right atrial size was normal in size.  Pericardium: There is no evidence of pericardial effusion.  Mitral Valve: The mitral valve is abnormal. Mild mitral annular calcification. No evidence of  mitral valve regurgitation. No evidence of mitral valve stenosis.  Tricuspid Valve: The tricuspid valve is normal in structure. Tricuspid valve regurgitation is not demonstrated.  Aortic Valve: The aortic valve is abnormal. Aortic valve regurgitation is mild. Aortic regurgitation PHT measures 1066 msec. No aortic stenosis is present.  Pulmonic Valve: The pulmonic valve was normal in structure. Pulmonic valve regurgitation is not visualized. No evidence of pulmonic stenosis.  Aorta: The aortic root, ascending aorta and aortic arch are all structurally normal, with no evidence of dilitation or obstruction.  Venous: The inferior vena cava is normal in size with greater than 50% respiratory variability, suggesting right atrial pressure of 3 mmHg.  IAS/Shunts: No atrial level shunt detected by color flow Doppler.   LEFT VENTRICLE PLAX 2D LVIDd:         4.80 cm   Diastology LVIDs:         2.90 cm   LV e' medial:    7.72 cm/s LV PW:         1.20 cm   LV E/e' medial:  9.4 LV IVS:        1.00 cm   LV e' lateral:   10.10 cm/s LVOT diam:     2.10 cm   LV E/e' lateral: 7.2 LV SV:         96 LV SV Index:   43 LVOT Area:     3.46 cm   RIGHT VENTRICLE             IVC RV S prime:     16.50 cm/s  IVC diam: 1.50 cm TAPSE (M-mode): 2.7 cm  LEFT ATRIUM           Index        RIGHT ATRIUM           Index LA diam:      3.90 cm 1.74 cm/m   RA Pressure: 3.00 mmHg LA Vol (A2C):  41.6 ml 18.60 ml/m  RA Area:     15.60 cm LA Vol (A4C): 64.3 ml 28.75 ml/m  RA Volume:   38.80 ml  17.35 ml/m AORTIC VALVE LVOT Vmax:   133.00 cm/s LVOT Vmean:  82.200 cm/s LVOT VTI:    0.276 m AI PHT:      1066 msec  AORTA Ao Root diam: 3.90 cm Ao Asc diam:  3.50 cm  MITRAL VALVE               TRICUSPID VALVE MV Area (PHT): 2.63 cm    Estimated RAP:  3.00 mmHg MV Decel Time: 288 msec MV E velocity: 72.80 cm/s  SHUNTS MV A velocity: 74.90 cm/s  Systemic VTI:  0.28 m MV E/A ratio:  0.97        Systemic Diam: 2.10 cm     Laboratory Data:  Chemistry Recent Labs  Lab 12/14/21 0949  NA 139  K 4.2  CL 102  CO2 25  GLUCOSE 72  BUN 17  CREATININE 1.05  CALCIUM 9.4    Recent Labs  Lab 12/14/21 0949  ALT 40   Hematology Recent Labs  Lab 12/14/21 0949  WBC 5.8  RBC 4.72  HGB 14.6  HCT 43.4  MCV 92  MCH 30.9  MCHC 33.6  RDW 12.6  PLT 269   Cardiac EnzymesNo results for input(s): "TROPONINI" in the last 168 hours. No results for input(s): "TROPIPOC" in the last 168 hours.  BNPNo results for input(s): "BNP", "PROBNP" in the last 168 hours.  DDimer  No results for input(s): "DDIMER" in the last 168 hours.  Radiology/Studies:  No results found.  Assessment and Plan:   Mild AI with evidence of PFE - no barriers to TEE.  Risks and benefits of procedure have been discussed with the patient.  These include bleeding, infection, kidney damage, stroke, heart attack, death, damage to teeth or esophagus.  The patient understands these risks and is willing to proceed.  Procedural considerations TEE with 3D for AI and to rule out PFE   For questions or updates, please contact [facility] Please consult [email or web address] for contact info under Cardiology/STEMI.   Riley Lam, MD FASE [facility] Cardiologist [facility]  [address] [place], [address] [place], [place] [address] [phone]  9:47 AM

## 2021-12-21 NOTE — Anesthesia Preprocedure Evaluation (Signed)
Anesthesia Evaluation  Patient identified by MRN, date of birth, ID band Patient awake    Reviewed: Allergy & Precautions, H&P , NPO status , Patient's Chart, lab work & pertinent test results  Airway Mallampati: II   Neck ROM: full    Dental   Pulmonary Current Smoker   breath sounds clear to auscultation       Cardiovascular hypertension, + Peripheral Vascular Disease  + Valvular Problems/Murmurs  Rhythm:regular Rate:Normal  Aortic valve mass   Neuro/Psych    GI/Hepatic ,GERD  ,,  Endo/Other    Renal/GU      Musculoskeletal   Abdominal   Peds  Hematology   Anesthesia Other Findings   Reproductive/Obstetrics                             Anesthesia Physical Anesthesia Plan  ASA: 3  Anesthesia Plan: MAC   Post-op Pain Management:    Induction: Intravenous  PONV Risk Score and Plan: 0 and Propofol infusion and Treatment may vary due to age or medical condition  Airway Management Planned: Nasal Cannula  Additional Equipment:   Intra-op Plan:   Post-operative Plan:   Informed Consent: I have reviewed the patients History and Physical, chart, labs and discussed the procedure including the risks, benefits and alternatives for the proposed anesthesia with the patient or authorized representative who has indicated his/her understanding and acceptance.     Dental advisory given  Plan Discussed with: CRNA, Anesthesiologist and Surgeon  Anesthesia Plan Comments:        Anesthesia Quick Evaluation

## 2021-12-21 NOTE — Progress Notes (Signed)
  Echocardiogram Echocardiogram Transesophageal has been performed.  Bobbye Charleston 12/21/2021, 10:31 AM

## 2021-12-21 NOTE — Anesthesia Postprocedure Evaluation (Signed)
Anesthesia Post Note  Patient: Jerry Martin  Procedure(s) Performed: TRANSESOPHAGEAL ECHOCARDIOGRAM (TEE) BUBBLE STUDY     Patient location during evaluation: PACU Anesthesia Type: MAC Level of consciousness: awake and alert Pain management: pain level controlled Vital Signs Assessment: post-procedure vital signs reviewed and stable Respiratory status: spontaneous breathing, nonlabored ventilation, respiratory function stable and patient connected to nasal cannula oxygen Cardiovascular status: stable and blood pressure returned to baseline Postop Assessment: no apparent nausea or vomiting Anesthetic complications: no   No notable events documented.  Last Vitals:  Vitals:   12/21/21 1050 12/21/21 1100  BP: 108/74 107/74  Pulse: (!) 46 (!) 45  Resp: 17 18  Temp:    SpO2: 96% 96%    Last Pain:  Vitals:   12/21/21 1100  TempSrc:   PainSc: 0-No pain                 Arlita Buffkin S

## 2021-12-21 NOTE — Anesthesia Procedure Notes (Signed)
Procedure Name: General with mask airway Date/Time: 12/21/2021 10:14 AM  Performed by: Erick Colace, CRNAPre-anesthesia Checklist: Patient identified, Emergency Drugs available, Suction available and Patient being monitored Patient Re-evaluated:Patient Re-evaluated prior to induction Oxygen Delivery Method: Nasal cannula Preoxygenation: Pre-oxygenation with 100% oxygen Induction Type: IV induction Airway Equipment and Method: Bite block Dental Injury: Teeth and Oropharynx as per pre-operative assessment

## 2021-12-21 NOTE — Discharge Instructions (Signed)

## 2021-12-23 ENCOUNTER — Encounter (HOSPITAL_COMMUNITY): Payer: Self-pay | Admitting: Internal Medicine

## 2022-02-19 DIAGNOSIS — H401222 Low-tension glaucoma, left eye, moderate stage: Secondary | ICD-10-CM | POA: Diagnosis not present

## 2022-02-19 DIAGNOSIS — H43392 Other vitreous opacities, left eye: Secondary | ICD-10-CM | POA: Diagnosis not present

## 2022-02-19 DIAGNOSIS — H04123 Dry eye syndrome of bilateral lacrimal glands: Secondary | ICD-10-CM | POA: Diagnosis not present

## 2022-02-19 DIAGNOSIS — H25813 Combined forms of age-related cataract, bilateral: Secondary | ICD-10-CM | POA: Diagnosis not present

## 2022-03-19 ENCOUNTER — Other Ambulatory Visit: Payer: PPO

## 2022-03-28 ENCOUNTER — Ambulatory Visit: Payer: PPO | Attending: Cardiovascular Disease

## 2022-03-28 DIAGNOSIS — Z79899 Other long term (current) drug therapy: Secondary | ICD-10-CM

## 2022-03-28 DIAGNOSIS — E782 Mixed hyperlipidemia: Secondary | ICD-10-CM

## 2022-03-28 LAB — LIPID PANEL
Chol/HDL Ratio: 2.6 ratio (ref 0.0–5.0)
Cholesterol, Total: 121 mg/dL (ref 100–199)
HDL: 46 mg/dL (ref >39)
LDL Chol Calc (NIH): 49 mg/dL (ref 0–99)
Triglycerides: 152 mg/dL — ABNORMAL HIGH (ref 0–149)
VLDL Cholesterol Cal: 26 mg/dL (ref 5–40)

## 2022-03-28 LAB — ALT: ALT: 63 IU/L — ABNORMAL HIGH (ref 0–44)

## 2022-04-10 DIAGNOSIS — H401222 Low-tension glaucoma, left eye, moderate stage: Secondary | ICD-10-CM | POA: Diagnosis not present

## 2022-04-10 DIAGNOSIS — H25813 Combined forms of age-related cataract, bilateral: Secondary | ICD-10-CM | POA: Diagnosis not present

## 2022-04-10 DIAGNOSIS — H04123 Dry eye syndrome of bilateral lacrimal glands: Secondary | ICD-10-CM | POA: Diagnosis not present

## 2022-05-21 ENCOUNTER — Ambulatory Visit (INDEPENDENT_AMBULATORY_CARE_PROVIDER_SITE_OTHER): Payer: PPO | Admitting: Internal Medicine

## 2022-05-21 ENCOUNTER — Encounter: Payer: Self-pay | Admitting: Internal Medicine

## 2022-05-21 ENCOUNTER — Ambulatory Visit (INDEPENDENT_AMBULATORY_CARE_PROVIDER_SITE_OTHER): Payer: PPO

## 2022-05-21 VITALS — BP 124/80 | HR 56 | Temp 97.9°F | Ht 73.0 in | Wt 220.0 lb

## 2022-05-21 DIAGNOSIS — R109 Unspecified abdominal pain: Secondary | ICD-10-CM | POA: Insufficient documentation

## 2022-05-21 DIAGNOSIS — M545 Low back pain, unspecified: Secondary | ICD-10-CM | POA: Insufficient documentation

## 2022-05-21 DIAGNOSIS — M4316 Spondylolisthesis, lumbar region: Secondary | ICD-10-CM | POA: Diagnosis not present

## 2022-05-21 DIAGNOSIS — M5136 Other intervertebral disc degeneration, lumbar region: Secondary | ICD-10-CM

## 2022-05-21 MED ORDER — CELECOXIB 100 MG PO CAPS: 100.0000 mg | ORAL_CAPSULE | Freq: Every day | ORAL | 0 refills | Status: DC

## 2022-05-21 NOTE — Progress Notes (Signed)
Subjective:  Patient ID: Jerry Martin, male    DOB: Feb 09, 1956  Age: 67 y.o. MRN: [id number]  CC: Back Pain   HPI Jerry Martin presents for f/up ----  He complains of a 3-week history of lower back pain that is more predominant on the right than the left.  The pain intermittently radiates to the right flank.  He has gotten some symptom relief with Tylenol.  He denies abdominal pain, dysuria, hematuria, nausea, vomiting, fever, chills, dizziness, or lightheadedness.  Outpatient Medications Prior to Visit  Medication Sig Dispense Refill   brimonidine (ALPHAGAN) 0.2 % ophthalmic solution Place 1 drop into the left eye 2 (two) times daily.     ezetimibe (ZETIA) 10 MG tablet Take 1 tablet (10 mg total) by mouth daily. 90 tablet 3   Multiple Vitamin (MULTIVITAMIN) capsule Take 1 capsule by mouth daily.     rosuvastatin (CRESTOR) 20 MG tablet Take 1 tablet (20 mg total) by mouth daily. 90 tablet 3   latanoprost (XALATAN) 0.005 % ophthalmic solution Place 1 drop into the left eye at bedtime. (Patient not taking: Reported on 05/21/2022)     No facility-administered medications prior to visit.    ROS Review of Systems  Constitutional: Negative.  Negative for diaphoresis, fatigue and fever.  HENT: Negative.    Eyes: Negative.   Respiratory:  Negative for cough, chest tightness, shortness of breath and wheezing.   Cardiovascular:  Negative for chest pain, palpitations and leg swelling.  Gastrointestinal:  Negative for abdominal pain, constipation, diarrhea, nausea and vomiting.  Endocrine: Negative.   Genitourinary:  Positive for flank pain. Negative for difficulty urinating, dysuria and hematuria.  Musculoskeletal:  Positive for back pain. Negative for arthralgias, joint swelling, myalgias and neck pain.  Skin: Negative.   Neurological:  Negative for dizziness, weakness and light-headedness.  Hematological:  Negative for adenopathy. Does not bruise/bleed easily.  Psychiatric/Behavioral:  Negative.      Objective:  BP 124/80 (BP Location: Left Arm, Patient Position: Sitting, Cuff Size: Normal)   Pulse (!) 56   Temp 97.9 F (36.6 C) (Oral)   Ht 6\' 1"  (1.854 m)   Wt 220 lb (99.8 kg)   SpO2 96%   BMI 29.03 kg/m   BP Readings from Last 3 Encounters:  05/21/22 124/80  12/21/21 107/74  12/03/21 104/72    Wt Readings from Last 3 Encounters:  05/21/22 220 lb (99.8 kg)  12/21/21 215 lb (97.5 kg)  10/25/21 219 lb (99.3 kg)    Physical Exam Vitals reviewed.  HENT:     Nose: Nose normal.     Mouth/Throat:     Mouth: Mucous membranes are moist.  Eyes:     General: No scleral icterus.    Conjunctiva/sclera: Conjunctivae normal.  Cardiovascular:     Rate and Rhythm: Normal rate and regular rhythm.     Heart sounds: No murmur heard. Pulmonary:     Effort: Pulmonary effort is normal.     Breath sounds: No stridor. No wheezing, rhonchi or rales.  Abdominal:     General: Abdomen is flat.     Palpations: There is no mass.     Tenderness: There is no abdominal tenderness. There is no guarding.     Hernia: No hernia is present.  Musculoskeletal:        General: Normal range of motion.     Cervical back: Neck supple.     Thoracic back: Normal.     Lumbar back: Normal. No bony tenderness. Normal range  of motion. Negative right straight leg raise test and negative left straight leg raise test.     Right lower leg: No edema.  Lymphadenopathy:     Cervical: No cervical adenopathy.  Skin:    General: Skin is warm and dry.     Findings: No rash.  Neurological:     General: No focal deficit present.     Mental Status: He is alert. Mental status is at baseline.     Deep Tendon Reflexes: Reflexes normal.     Reflex Scores:      Tricep reflexes are 0 on the right side and 0 on the left side.      Bicep reflexes are 1+ on the right side and 1+ on the left side.      Brachioradialis reflexes are 1+ on the right side and 1+ on the left side.      Patellar reflexes are 2+  on the right side and 2+ on the left side.      Achilles reflexes are 1+ on the right side and 1+ on the left side. Psychiatric:        Mood and Affect: Mood normal.        Behavior: Behavior normal.     Lab Results  Component Value Date   WBC 5.8 12/14/2021   HGB 14.6 12/14/2021   HCT 43.4 12/14/2021   PLT 269 12/14/2021   GLUCOSE 72 12/14/2021   CHOL 121 03/28/2022   TRIG 152 (H) 03/28/2022   HDL 46 03/28/2022   LDLCALC 49 03/28/2022   ALT 63 (H) 03/28/2022   AST 19 09/24/2021   NA 139 12/14/2021   K 4.2 12/14/2021   CL 102 12/14/2021   CREATININE 1.05 12/14/2021   BUN 17 12/14/2021   CO2 25 12/14/2021   TSH 1.82 09/24/2021   PSA 0.31 09/24/2021    ECHO TEE  Result Date: 12/21/2021    TRANSESOPHOGEAL ECHO REPORT   Patient Name:   Jerry Martin Date of Exam: 12/21/2021 Medical Rec #:  [id number]  Height:       73.0 in Accession #:    [id number] Weight:       215.0 lb Date of Birth:  [birth year]  BSA:          2.219 m Patient Age:    66 years   BP:           91/58 mmHg Patient Gender: M          HR:           66 bpm. Exam Location:  Inpatient Procedure: Transesophageal Echo, 3D Echo, Cardiac Doppler and Color Doppler Indications:     I35.8 Other nonrheumatic aortic valve disorders  History:         Patient has prior history of Echocardiogram examinations.                  Abnormal ECG, Aortic Valve Disease, Arrythmias:Bradycardia;                  Risk Factors:Hypertension and Dyslipidemia.  Sonographer:     Sheralyn Boatman RDCS Referring Phys:  [id number] PHILIP J NAHSER Diagnosing Phys: Riley Lam MD PROCEDURE: After discussion of the risks and benefits of a TEE, an informed consent was obtained from the patient. The transesophogeal probe was passed without difficulty through the esophogus of the patient. Imaged were obtained with the patient in a left lateral decubitus position. Sedation performed by different physician. The patient was monitored  while under deep sedation. Anesthestetic  sedation was provided intravenously by Anesthesiology:  of Propofol. The patient's vital signs; including heart rate, blood pressure, and oxygen saturation; remained stable throughout the procedure. The patient developed no complications during the procedure.  IMPRESSIONS  1. Left ventricular ejection fraction, by estimation, is 60 to 65%. The left ventricle has normal function.  2. Right ventricular systolic function is normal. The right ventricular size is normal.  3. No left atrial/left atrial appendage thrombus was detected.  4. The mitral valve is abnormal. No evidence of mitral valve regurgitation. No evidence of mitral stenosis.  5. The aortic valve is tricuspid. Aortic valve regurgitation is trivial. Aortic valve sclerosis is present, with no evidence of aortic valve stenosis.  6. Evidence of atrial level shunting detected by color flow Doppler. Agitated saline contrast bubble study was positive with shunting observed within 3-6 cardiac cycles suggestive of interatrial shunt. There is a small atrial septal defect. There is a small patent foramen ovale. Comparison(s): There is no true papillary fibroelastoma. FINDINGS  Left Ventricle: Left ventricular ejection fraction, by estimation, is 60 to 65%. The left ventricle has normal function. The left ventricular internal cavity size was normal in size. There is no left ventricular hypertrophy. Right Ventricle: The right ventricular size is normal. No increase in right ventricular wall thickness. Right ventricular systolic function is normal. Left Atrium: Left atrial size was normal in size. No left atrial/left atrial appendage thrombus was detected. Right Atrium: Right atrial size was normal in size. Prominent Chiari network. Pericardium: There is no evidence of pericardial effusion. Mitral Valve: The mitral valve is abnormal. No evidence of mitral valve regurgitation. No evidence of mitral valve stenosis. Tricuspid Valve: The tricuspid valve is normal in  structure. Tricuspid valve regurgitation is not demonstrated. No evidence of tricuspid stenosis. Aortic Valve: The aortic valve is tricuspid. Aortic valve regurgitation is trivial. Aortic valve sclerosis is present, with no evidence of aortic valve stenosis. Pulmonic Valve: The pulmonic valve was normal in structure. Pulmonic valve regurgitation is mild. No evidence of pulmonic stenosis. Aorta: The aortic root, ascending aorta, aortic arch and descending aorta are all structurally normal, with no evidence of dilitation or obstruction. IAS/Shunts: The interatrial septum appears to be lipomatous. Evidence of atrial level shunting detected by color flow Doppler. Agitated saline contrast was given intravenously to evaluate for intracardiac shunting. Agitated saline contrast bubble study was positive with shunting observed within 3-6 cardiac cycles suggestive of interatrial shunt. A small patent foramen ovale is detected. There is a small atrial septal defect. Additional Comments: Spectral Doppler performed. Riley Lam MD Electronically signed by Riley Lam MD Signature Date/Time: 12/21/2021/5:21:16 PM    Final    DG Lumbar Spine Complete  Result Date: 05/21/2022 CLINICAL DATA:  Low back pain. EXAM: LUMBAR SPINE - COMPLETE 4+ VIEW COMPARISON:  None Available. FINDINGS: There are 5 non rib-bearing lumbar type vertebrae. Slight lumbar levoscoliosis is noted. There is trace retrolisthesis of L2 on L3. Slight anterior wedging of the T11, T12, and L1 vertebral bodies has a chronic/degenerative appearance. No definite acute fracture is identified. There is moderate disc space narrowing from L2-3 through L4-5. Moderate to prominent multilevel vertebral osteophytosis is noted in the lumbar and lower thoracic spine. IMPRESSION: Moderate multilevel lumbar disc degeneration. No acute osseous abnormality identified. Electronically Signed   By: Sebastian Ache M.D.   On: 05/21/2022 15:07     Assessment & Plan:     Acute right flank pain- UA is normal. -  Urinalysis, Routine w reflex microscopic; Future  Acute bilateral low back pain without sciatica- Plain films are positive for DDD.  He is neurologically intact.  Will treat with a Cox 2 inhibitor. -     DG Lumbar Spine Complete; Future -     Celecoxib; Take 1 capsule (100 mg total) by mouth daily.  Dispense: 90 capsule; Refill: 0  DDD (degenerative disc disease), lumbar -     Celecoxib; Take 1 capsule (100 mg total) by mouth daily.  Dispense: 90 capsule; Refill: 0     Follow-up: Return in about 4 months (around 09/20/2022).  Sanda Linger, MD

## 2022-05-21 NOTE — Patient Instructions (Signed)
Degenerative Disk Disease ? ?Degenerative disk disease is a condition caused by changes that occur in the spinal disks as a person ages. Spinal disks are soft and compressible disks located between the bones of your spine (vertebrae). These disks act like shock absorbers. ?Degenerative disk disease can affect the whole spine. However, the neck and lower back are most often affected. Many changes can occur in the spinal disks with aging, such as: ?The spinal disks may dry and shrink. ?Small tears may occur in the tough, outer covering of the disk (annulus). ?The disk space may become smaller due to loss of water. ?Abnormal growths in the bone (spurs) may occur. This can put pressure on the nerve roots exiting the spinal canal, causing pain. ?The spinal canal may become narrowed. ?What are the causes? ?This condition may be caused by: ?Normal degeneration with age. ?Injuries. ?Certain activities and sports that cause damage. ?What increases the risk? ?The following factors may make you more likely to develop this condition: ?Being overweight. ?Having a family history of degenerative disk disease. ?Smoking and use of products that contain nicotine and tobacco. ?Sudden injury. ?Doing work that requires heavy lifting. ?What are the signs or symptoms? ?Symptoms of this condition include: ?Pain that varies in intensity. Some people have no pain, while others have severe pain. The location of the pain depends on the part of your backbone that is affected. You may have: ?Pain in your neck or arm if a disk in your neck area is affected. ?Pain in your back, buttocks, or legs if a disk in your lower back is affected. ?Pain that becomes worse while bending or reaching up, or with twisting movements. ?Pain that may start gradually and worsen as time passes. It may also start after a major or minor injury. ?Numbness or tingling in the arms or legs. ?How is this diagnosed? ?This condition may be diagnosed based on: ?Your symptoms  and medical history. ?A physical exam. ?Imaging tests, including: ?X-ray of the spine. ?CT scan. ?MRI. ?How is this treated? ?This condition may be treated with: ?Medicines. ?Injection of steroids into the back. ?Rehabilitation exercises. These activities aim to strengthen muscles in your back and abdomen to better support your spine. ?If treatments do not help to relieve your symptoms or you have severe pain, you may need surgery. ?Follow these instructions at home: ?Medicines ?Take over-the-counter and prescription medicines only as told by your health care provider. ?Ask your health care provider if the medicine prescribed to you: ?Requires you to avoid driving or using machinery. ?Can cause constipation. You may need to take these actions to prevent or treat constipation: ?Drink enough fluid to keep your urine pale yellow. ?Take over-the-counter or prescription medicines. ?Eat foods that are high in fiber, such as beans, whole grains, and fresh fruits and vegetables. ?Limit foods that are high in fat and processed sugars, such as fried or sweet foods. ?Activity ?Rest as told by your health care provider. ?Avoid sitting for a long time without moving. Get up to take short walks every 1-2 hours. This is important to improve blood flow and breathing. Ask for help if you feel weak or unsteady. ?Return to your normal activities as told by your health care provider. Ask your health care provider what activities are safe for you. ?Perform relaxation exercises as told by your health care provider. ?Maintain good posture. ?Do not lift anything that is heavier than 10 lb (4.5 kg), or the limit that you are told, until your health   care provider says that it is safe. ?Follow proper lifting and walking techniques as told by your health care provider. ?Managing pain, stiffness, and swelling ? ?  ? ?If directed, put ice on the painful area. Icing can help to relieve pain. To do this: ?Put ice in a plastic bag. ?Place a towel  between your skin and the bag. ?Leave the ice on for 20 minutes, 2-3 times a day. ?Remove the ice if your skin turns bright red. This is very important. If you cannot feel pain, heat, or cold, you have a greater risk of damage to the area. ?If directed, apply heat to the painful area as often as told by your health care provider. Heat can reduce the stiffness of your muscles. Use the heat source that your health care provider recommends, such as a moist heat pack or a heating pad. ?Place a towel between your skin and the heat source. ?Leave the heat on for 20-30 minutes. ?Remove the heat if your skin turns bright red. This is especially important if you are unable to feel pain, heat, or cold. You may have a greater risk of getting burned. ?General instructions ?Change your sitting, standing, and sleeping habits as told by your health care provider. ?Avoid sitting in the same position for long periods of time. Change positions frequently. ?Lose weight or maintain a healthy weight as told by your health care provider. ?Do not use any products that contain nicotine or tobacco, such as cigarettes, e-cigarettes, and chewing tobacco. If you need help quitting, ask your health care provider. ?Wear supportive footwear. ?Keep all follow-up visits. This is important. This may include visits for physical therapy. ?Contact a health care provider if you: ?Have pain that does not go away within 1-4 weeks. ?Lose your appetite. ?Lose weight without trying. ?Get help right away if you: ?Have severe pain. ?Notice weakness in your arms, hands, or legs. ?Begin to lose control of your bladder or bowel movements. ?Have fevers or night sweats. ?Summary ?Degenerative disk disease is a condition caused by changes that occur in the spinal disks as a person ages. ?This condition can affect the whole spine. However, the neck and lower back are most often affected. ?Take over-the-counter and prescription medicines only as told by your health  care provider. ?This information is not intended to replace advice given to you by your health care provider. Make sure you discuss any questions you have with your health care provider. ?Document Revised: 05/20/2019 Document Reviewed: 05/20/2019 ?Elsevier Patient Education ? 2023 [employer] ? ?

## 2022-05-22 LAB — URINALYSIS, ROUTINE W REFLEX MICROSCOPIC
Bilirubin Urine: NEGATIVE
Hgb urine dipstick: NEGATIVE
Ketones, ur: NEGATIVE
Leukocytes,Ua: NEGATIVE
Nitrite: NEGATIVE
RBC / HPF: NONE SEEN (ref 0–?)
Specific Gravity, Urine: 1.025 (ref 1.000–1.030)
Total Protein, Urine: NEGATIVE
Urine Glucose: NEGATIVE
Urobilinogen, UA: 0.2 (ref 0.0–1.0)
WBC, UA: NONE SEEN (ref 0–?)
pH: 6 (ref 5.0–8.0)

## 2022-08-17 ENCOUNTER — Other Ambulatory Visit: Payer: Self-pay | Admitting: Internal Medicine

## 2022-08-17 DIAGNOSIS — M545 Low back pain, unspecified: Secondary | ICD-10-CM

## 2022-08-17 DIAGNOSIS — M5136 Other intervertebral disc degeneration, lumbar region: Secondary | ICD-10-CM

## 2022-08-28 DIAGNOSIS — H43392 Other vitreous opacities, left eye: Secondary | ICD-10-CM | POA: Diagnosis not present

## 2022-08-28 DIAGNOSIS — H04123 Dry eye syndrome of bilateral lacrimal glands: Secondary | ICD-10-CM | POA: Diagnosis not present

## 2022-08-28 DIAGNOSIS — H25813 Combined forms of age-related cataract, bilateral: Secondary | ICD-10-CM | POA: Diagnosis not present

## 2022-08-28 DIAGNOSIS — H401222 Low-tension glaucoma, left eye, moderate stage: Secondary | ICD-10-CM | POA: Diagnosis not present

## 2022-08-28 DIAGNOSIS — H524 Presbyopia: Secondary | ICD-10-CM | POA: Diagnosis not present

## 2022-09-10 ENCOUNTER — Other Ambulatory Visit: Payer: Self-pay | Admitting: Internal Medicine

## 2022-09-10 DIAGNOSIS — E785 Hyperlipidemia, unspecified: Secondary | ICD-10-CM

## 2022-09-23 DIAGNOSIS — Q66229 Congenital metatarsus adductus, unspecified foot: Secondary | ICD-10-CM | POA: Diagnosis not present

## 2022-09-23 DIAGNOSIS — M2012 Hallux valgus (acquired), left foot: Secondary | ICD-10-CM | POA: Diagnosis not present

## 2022-09-23 DIAGNOSIS — M2011 Hallux valgus (acquired), right foot: Secondary | ICD-10-CM | POA: Diagnosis not present

## 2022-09-23 DIAGNOSIS — M2042 Other hammer toe(s) (acquired), left foot: Secondary | ICD-10-CM | POA: Diagnosis not present

## 2022-09-23 DIAGNOSIS — M2041 Other hammer toe(s) (acquired), right foot: Secondary | ICD-10-CM | POA: Diagnosis not present

## 2022-10-20 HISTORY — PX: FOOT SURGERY: SHX648

## 2022-11-11 DIAGNOSIS — M19071 Primary osteoarthritis, right ankle and foot: Secondary | ICD-10-CM | POA: Diagnosis not present

## 2022-11-11 DIAGNOSIS — M21611 Bunion of right foot: Secondary | ICD-10-CM | POA: Diagnosis not present

## 2022-11-11 DIAGNOSIS — G8918 Other acute postprocedural pain: Secondary | ICD-10-CM | POA: Diagnosis not present

## 2022-11-11 DIAGNOSIS — M24574 Contracture, right foot: Secondary | ICD-10-CM | POA: Diagnosis not present

## 2022-11-11 DIAGNOSIS — M2011 Hallux valgus (acquired), right foot: Secondary | ICD-10-CM | POA: Diagnosis not present

## 2022-11-11 DIAGNOSIS — Q66229 Congenital metatarsus adductus, unspecified foot: Secondary | ICD-10-CM | POA: Diagnosis not present

## 2022-11-11 DIAGNOSIS — M2041 Other hammer toe(s) (acquired), right foot: Secondary | ICD-10-CM | POA: Diagnosis not present

## 2022-11-18 DIAGNOSIS — M2041 Other hammer toe(s) (acquired), right foot: Secondary | ICD-10-CM | POA: Diagnosis not present

## 2022-11-18 DIAGNOSIS — M19071 Primary osteoarthritis, right ankle and foot: Secondary | ICD-10-CM | POA: Diagnosis not present

## 2022-11-18 DIAGNOSIS — M2011 Hallux valgus (acquired), right foot: Secondary | ICD-10-CM | POA: Diagnosis not present

## 2022-11-18 DIAGNOSIS — Q66229 Congenital metatarsus adductus, unspecified foot: Secondary | ICD-10-CM | POA: Diagnosis not present

## 2022-11-19 ENCOUNTER — Other Ambulatory Visit: Payer: Self-pay | Admitting: Internal Medicine

## 2022-11-19 DIAGNOSIS — M51369 Other intervertebral disc degeneration, lumbar region without mention of lumbar back pain or lower extremity pain: Secondary | ICD-10-CM

## 2022-11-19 DIAGNOSIS — M545 Low back pain, unspecified: Secondary | ICD-10-CM

## 2022-12-02 DIAGNOSIS — Q66229 Congenital metatarsus adductus, unspecified foot: Secondary | ICD-10-CM | POA: Diagnosis not present

## 2022-12-02 DIAGNOSIS — M2041 Other hammer toe(s) (acquired), right foot: Secondary | ICD-10-CM | POA: Diagnosis not present

## 2022-12-02 DIAGNOSIS — M19071 Primary osteoarthritis, right ankle and foot: Secondary | ICD-10-CM | POA: Diagnosis not present

## 2022-12-02 DIAGNOSIS — M2011 Hallux valgus (acquired), right foot: Secondary | ICD-10-CM | POA: Diagnosis not present

## 2022-12-22 ENCOUNTER — Other Ambulatory Visit: Payer: Self-pay | Admitting: Cardiovascular Disease

## 2022-12-22 DIAGNOSIS — E782 Mixed hyperlipidemia: Secondary | ICD-10-CM

## 2022-12-22 DIAGNOSIS — Z79899 Other long term (current) drug therapy: Secondary | ICD-10-CM

## 2022-12-23 DIAGNOSIS — M2011 Hallux valgus (acquired), right foot: Secondary | ICD-10-CM | POA: Diagnosis not present

## 2022-12-23 DIAGNOSIS — Q66229 Congenital metatarsus adductus, unspecified foot: Secondary | ICD-10-CM | POA: Diagnosis not present

## 2022-12-23 DIAGNOSIS — M2041 Other hammer toe(s) (acquired), right foot: Secondary | ICD-10-CM | POA: Diagnosis not present

## 2022-12-30 DIAGNOSIS — M2041 Other hammer toe(s) (acquired), right foot: Secondary | ICD-10-CM | POA: Diagnosis not present

## 2022-12-30 DIAGNOSIS — Q66229 Congenital metatarsus adductus, unspecified foot: Secondary | ICD-10-CM | POA: Diagnosis not present

## 2022-12-30 DIAGNOSIS — M2011 Hallux valgus (acquired), right foot: Secondary | ICD-10-CM | POA: Diagnosis not present

## 2023-01-24 ENCOUNTER — Other Ambulatory Visit: Payer: Self-pay | Admitting: Internal Medicine

## 2023-01-24 DIAGNOSIS — E785 Hyperlipidemia, unspecified: Secondary | ICD-10-CM

## 2023-01-28 ENCOUNTER — Other Ambulatory Visit: Payer: Self-pay

## 2023-01-28 DIAGNOSIS — E782 Mixed hyperlipidemia: Secondary | ICD-10-CM

## 2023-01-28 DIAGNOSIS — Z79899 Other long term (current) drug therapy: Secondary | ICD-10-CM

## 2023-01-28 MED ORDER — EZETIMIBE 10 MG PO TABS: 10.0000 mg | ORAL_TABLET | Freq: Every day | ORAL | 0 refills | Status: DC

## 2023-02-05 DIAGNOSIS — M2041 Other hammer toe(s) (acquired), right foot: Secondary | ICD-10-CM | POA: Diagnosis not present

## 2023-02-05 DIAGNOSIS — M2011 Hallux valgus (acquired), right foot: Secondary | ICD-10-CM | POA: Diagnosis not present

## 2023-02-05 DIAGNOSIS — Q66229 Congenital metatarsus adductus, unspecified foot: Secondary | ICD-10-CM | POA: Diagnosis not present

## 2023-02-09 ENCOUNTER — Other Ambulatory Visit: Payer: Self-pay | Admitting: Cardiovascular Disease

## 2023-02-09 DIAGNOSIS — E782 Mixed hyperlipidemia: Secondary | ICD-10-CM

## 2023-02-09 DIAGNOSIS — Z79899 Other long term (current) drug therapy: Secondary | ICD-10-CM

## 2023-02-15 ENCOUNTER — Emergency Department (HOSPITAL_COMMUNITY): Payer: PPO

## 2023-02-15 ENCOUNTER — Other Ambulatory Visit: Payer: Self-pay

## 2023-02-15 ENCOUNTER — Emergency Department (HOSPITAL_COMMUNITY)
Admission: EM | Admit: 2023-02-15 | Discharge: 2023-02-16 | Disposition: A | Discharge status: Home / Self Care | Payer: PPO | Attending: Emergency Medicine | Admitting: Emergency Medicine

## 2023-02-15 ENCOUNTER — Encounter (HOSPITAL_COMMUNITY): Payer: Self-pay

## 2023-02-15 DIAGNOSIS — K449 Diaphragmatic hernia without obstruction or gangrene: Secondary | ICD-10-CM | POA: Diagnosis not present

## 2023-02-15 DIAGNOSIS — D509 Iron deficiency anemia, unspecified: Secondary | ICD-10-CM | POA: Diagnosis not present

## 2023-02-15 DIAGNOSIS — D649 Anemia, unspecified: Secondary | ICD-10-CM

## 2023-02-15 DIAGNOSIS — R0602 Shortness of breath: Secondary | ICD-10-CM | POA: Diagnosis not present

## 2023-02-15 LAB — IRON AND TIBC
Iron: 19 ug/dL — ABNORMAL LOW (ref 45–182)
Saturation Ratios: 4 % — ABNORMAL LOW (ref 17.9–39.5)
TIBC: 542 ug/dL — ABNORMAL HIGH (ref 250–450)
UIBC: 523 ug/dL

## 2023-02-15 LAB — URINALYSIS, W/ REFLEX TO CULTURE (INFECTION SUSPECTED)
Bacteria, UA: NONE SEEN
Bilirubin Urine: NEGATIVE
Glucose, UA: NEGATIVE mg/dL
Hgb urine dipstick: NEGATIVE
Ketones, ur: NEGATIVE mg/dL
Leukocytes,Ua: NEGATIVE
Nitrite: NEGATIVE
Protein, ur: NEGATIVE mg/dL
Specific Gravity, Urine: 1.02 (ref 1.005–1.030)
pH: 5 (ref 5.0–8.0)

## 2023-02-15 LAB — COMPREHENSIVE METABOLIC PANEL
ALT: 18 U/L (ref 0–44)
AST: 25 U/L (ref 15–41)
Albumin: 4.2 g/dL (ref 3.5–5.0)
Alkaline Phosphatase: 52 U/L (ref 38–126)
Anion gap: 9 (ref 5–15)
BUN: 24 mg/dL — ABNORMAL HIGH (ref 8–23)
CO2: 22 mmol/L (ref 22–32)
Calcium: 9.4 mg/dL (ref 8.9–10.3)
Chloride: 109 mmol/L (ref 98–111)
Creatinine, Ser: 1.09 mg/dL (ref 0.61–1.24)
GFR, Estimated: >60 mL/min (ref >60)
Glucose, Bld: 105 mg/dL — ABNORMAL HIGH (ref 70–99)
Potassium: 4.1 mmol/L (ref 3.5–5.1)
Sodium: 140 mmol/L (ref 135–145)
Total Bilirubin: 0.4 mg/dL (ref <1.2)
Total Protein: 7 g/dL (ref 6.5–8.1)

## 2023-02-15 LAB — RETICULOCYTES
Immature Retic Fract: 23.7 % — ABNORMAL HIGH (ref 2.3–15.9)
RBC.: 3.4 MIL/uL — ABNORMAL LOW (ref 4.22–5.81)
Retic Count, Absolute: 48.5 10*3/uL (ref 19.0–186.0)
Retic Ct Pct: 1.5 % (ref 0.4–3.1)

## 2023-02-15 LAB — CBC WITH DIFFERENTIAL/PLATELET
Abs Immature Granulocytes: 0.02 10*3/uL (ref 0.00–0.07)
Basophils Absolute: 0 10*3/uL (ref 0.0–0.1)
Basophils Relative: 1 %
Eosinophils Absolute: 1.2 10*3/uL — ABNORMAL HIGH (ref 0.0–0.5)
Eosinophils Relative: 16 %
HCT: 25.1 % — ABNORMAL LOW (ref 39.0–52.0)
Hemoglobin: 7.6 g/dL — ABNORMAL LOW (ref 13.0–17.0)
Immature Granulocytes: 0 %
Lymphocytes Relative: 22 %
Lymphs Abs: 1.7 10*3/uL (ref 0.7–4.0)
MCH: 23.7 pg — ABNORMAL LOW (ref 26.0–34.0)
MCHC: 30.3 g/dL (ref 30.0–36.0)
MCV: 78.2 fL — ABNORMAL LOW (ref 80.0–100.0)
Monocytes Absolute: 0.6 10*3/uL (ref 0.1–1.0)
Monocytes Relative: 7 %
Neutro Abs: 4.2 10*3/uL (ref 1.7–7.7)
Neutrophils Relative %: 54 %
Platelets: 394 10*3/uL (ref 150–400)
RBC: 3.21 MIL/uL — ABNORMAL LOW (ref 4.22–5.81)
RDW: 16.9 % — ABNORMAL HIGH (ref 11.5–15.5)
WBC: 7.7 10*3/uL (ref 4.0–10.5)
nRBC: 0 % (ref 0.0–0.2)

## 2023-02-15 LAB — ABO/RH: ABO/RH(D): A POS

## 2023-02-15 LAB — PREPARE RBC (CROSSMATCH)

## 2023-02-15 LAB — FOLATE: Folate: 15 ng/mL (ref >5.9)

## 2023-02-15 LAB — VITAMIN B12: Vitamin B-12: 330 pg/mL (ref 180–914)

## 2023-02-15 LAB — POC OCCULT BLOOD, ED: Fecal Occult Bld: NEGATIVE

## 2023-02-15 LAB — FERRITIN: Ferritin: 4 ng/mL — ABNORMAL LOW (ref 24–336)

## 2023-02-15 LAB — D-DIMER, QUANTITATIVE: D-Dimer, Quant: <0.27 ug{FEU}/mL (ref 0.00–0.50)

## 2023-02-15 LAB — BRAIN NATRIURETIC PEPTIDE: B Natriuretic Peptide: 52.7 pg/mL (ref 0.0–100.0)

## 2023-02-15 LAB — TROPONIN I (HIGH SENSITIVITY): Troponin I (High Sensitivity): <2 ng/L (ref <18)

## 2023-02-15 MED ORDER — SODIUM CHLORIDE 0.9% IV SOLUTION: Freq: Once | INTRAVENOUS | Status: AC

## 2023-02-15 MED ORDER — FERROUS SULFATE 325 (65 FE) MG PO TABS: 325.0000 mg | ORAL_TABLET | Freq: Every day | ORAL | 0 refills | Status: DC

## 2023-02-15 NOTE — Discharge Instructions (Addendum)
You were seen in the emergency department for your weakness and fatigue.  You were found to be anemic today and her hemoglobin was 7.6.  We did give you 1 unit of blood in the emergency department.  Your iron levels were low so I have given you a prescription for an iron supplement and I recommend that you take this every day as prescribed.  You should follow-up with your primary doctor in the next few days to have your symptoms and hemoglobin rechecked and for further workup as far as the cause of your iron deficiency anemia.  You should return to the emergency department if you are having increased weakness or fatigue, you become lightheaded and pass out or if you have any other new or concerning symptoms.

## 2023-02-15 NOTE — ED Triage Notes (Addendum)
Pt complaining of SOB and feelings of anxiety. Pt states that it is excertional SOB, but did have an episode today where he became SOB, and had tunnel vision. Pt had a surgery on his foot on sept 23. Denies any N/V/D, or new pains. Guest of pt also noting that pt is much paler than normal.

## 2023-02-15 NOTE — ED Provider Notes (Signed)
[name] Provider Note   CSN: [id number] Arrival date & time: 02/15/23  1540     History  Chief Complaint  Patient presents with   Shortness of Breath         Jerry Martin is a 67 y.o. male.  Patient is a 67 year old male with a past medical history of hyperlipidemia and a foot surgery in September presenting to the emergency department with shortness of breath and fatigue.  The patient states that since his surgery he has had increasing fatigue and shortness of breath.  He states he is only able to walk about half a block before he becomes short of breath and fatigued.  He states that he did have a recent death in the family and has not been eating as well but denies any recent vomiting or diarrhea, black or bloody stools.  He states that today he was at show and suddenly felt like he was having a panic attack where he became very short of breath and dizzy.  He states that his wife was able to calm him down.  He states he has had no associated chest pain.  He denies any history of VTE but states that he has been off his leg for about 10 weeks after the surgery.  He states that he still has persistent swelling in his right foot and ankle from the surgery but denies any calf swelling or pain.  The history is provided by the patient and the spouse.  Shortness of Breath      Home Medications Prior to Admission medications   Medication Sig Start Date End Date Taking? Authorizing Provider  ferrous sulfate 325 (65 FE) MG tablet Take 1 tablet (325 mg total) by mouth daily. 02/15/23  Yes Theresia Lo, Benetta Spar K, DO  brimonidine (ALPHAGAN) 0.2 % ophthalmic solution Place 1 drop into the left eye 2 (two) times daily. 04/10/22   [provider]  celecoxib (CELEBREX) 100 MG capsule TAKE 1 CAPSULE(100 MG) BY MOUTH DAILY 08/17/22   Etta Grandchild, MD  ezetimibe (ZETIA) 10 MG tablet TAKE 1 TABLET BY MOUTH DAILY *NEED APPOINTMENT FOR FURTHER  REFILLS, PER MD* 02/10/23   Nahser, Deloris Ping, MD  latanoprost (XALATAN) 0.005 % ophthalmic solution Place 1 drop into the left eye at bedtime. Patient not taking: Reported on 05/21/2022    [provider]  Multiple Vitamin (MULTIVITAMIN) capsule Take 1 capsule by mouth daily.    [provider]  rosuvastatin (CRESTOR) 20 MG tablet TAKE 1 TABLET(20 MG) BY MOUTH DAILY 09/10/22   Etta Grandchild, MD      Allergies    Penicillins and Irbesartan    Review of Systems   Review of Systems  Respiratory:  Positive for shortness of breath.     Physical Exam Updated Vital Signs BP 121/71   Pulse (!) 50   Temp 98.3 F (36.8 C)   Resp 15   Ht 6\' 1"  (1.854 m)   Wt 97.5 kg   SpO2 (!) 88%   BMI 28.37 kg/m  Physical Exam Vitals and nursing note reviewed.  Constitutional:      General: He is not in acute distress.    Appearance: He is well-developed.  HENT:     Head: Normocephalic.     Mouth/Throat:     Mouth: Mucous membranes are moist.  Eyes:     Extraocular Movements: Extraocular movements intact.  Cardiovascular:     Rate and Rhythm: Normal rate  and regular rhythm.  Pulmonary:     Effort: Pulmonary effort is normal.     Breath sounds: Normal breath sounds.  Abdominal:     Palpations: Abdomen is soft.     Tenderness: There is no abdominal tenderness.  Musculoskeletal:        General: Normal range of motion.     Cervical back: Normal range of motion and neck supple.     Right lower leg: Edema (Foot/ankle, no calf swelling) present.     Left lower leg: No edema.  Skin:    General: Skin is warm and dry.  Neurological:     General: No focal deficit present.     Mental Status: He is alert and oriented to person, place, and time.  Psychiatric:        Mood and Affect: Mood normal.        Behavior: Behavior normal.     ED Results / Procedures / Treatments   Labs (all labs ordered are listed, but only abnormal results are displayed) Labs Reviewed  COMPREHENSIVE  METABOLIC PANEL - Abnormal; Notable for the following components:      Result Value   Glucose, Bld 105 (*)    BUN 24 (*)    All other components within normal limits  CBC WITH DIFFERENTIAL/PLATELET - Abnormal; Notable for the following components:   RBC 3.21 (*)    Hemoglobin 7.6 (*)    HCT 25.1 (*)    MCV 78.2 (*)    MCH 23.7 (*)    RDW 16.9 (*)    Eosinophils Absolute 1.2 (*)    All other components within normal limits  IRON AND TIBC - Abnormal; Notable for the following components:   Iron 19 (*)    TIBC 542 (*)    Saturation Ratios 4 (*)    All other components within normal limits  FERRITIN - Abnormal; Notable for the following components:   Ferritin 4 (*)    All other components within normal limits  RETICULOCYTES - Abnormal; Notable for the following components:   RBC. 3.40 (*)    Immature Retic Fract 23.7 (*)    All other components within normal limits  URINALYSIS, W/ REFLEX TO CULTURE (INFECTION SUSPECTED)  D-DIMER, QUANTITATIVE  BRAIN NATRIURETIC PEPTIDE  VITAMIN B12  FOLATE  POC OCCULT BLOOD, ED  TYPE AND SCREEN  PREPARE RBC (CROSSMATCH)  ABO/RH  TROPONIN I (HIGH SENSITIVITY)    EKG EKG Interpretation Date/Time:  Saturday February 15 2023 16:03:58 EST Ventricular Rate:  54 PR Interval:  193 QRS Duration:  144 QT Interval:  426 QTC Calculation: 404 R Axis:   15  Text Interpretation: Sinus rhythm Right bundle branch block No previous ECGs available Confirmed by Elayne Snare ([id number]) on 02/15/2023 4:12:15 PM  Radiology DG Chest 2 View Result Date: 02/15/2023 CLINICAL DATA:  Shortness of breath. EXAM: CHEST - 2 VIEW COMPARISON:  None Available. FINDINGS: Bilateral lung fields are clear. Bilateral costophrenic angles are clear. Normal cardio-mediastinal silhouette. Small retrocardiac hiatal hernia noted. No acute osseous abnormalities. The soft tissues are within normal limits. IMPRESSION: No active cardiopulmonary disease. Electronically Signed   By:  Jules Schick M.D.   On: 02/15/2023 17:29    Procedures .Critical Care  Performed by: Rexford Maus, DO Authorized by: Rexford Maus, DO   Critical care provider statement:    Critical care time (minutes):  30   Critical care was necessary to treat or prevent imminent or life-threatening deterioration of the following conditions:  Circulatory failure   Critical care was time spent personally by me on the following activities:  Development of treatment plan with patient or surrogate, discussions with consultants, evaluation of patient's response to treatment, examination of patient, ordering and review of laboratory studies, ordering and review of radiographic studies, ordering and performing treatments and interventions, pulse oximetry, re-evaluation of patient's condition and review of old charts     Medications Ordered in ED Medications  0.9 %  sodium chloride infusion (Manually program via Guardrails IV Fluids) (has no administration in time range)    ED Course/ Medical Decision Making/ A&P Clinical Course as of 02/15/23 2311  Sat Feb 15, 2023  1713 Hgb 7.6 from baseline 14 1 year ago. Will performed hemoccult. [VK]  1735 D-dimer negative, making PE unlikely. [VK]  1753 Brown stool on hemoccult. Hemoccult negative. Patient is agreeable for transfusion for symptomatic anemia. [VK]  2051 Low ferritin and iron concerning for iron deficiency anemia. Will have iron sent to pharmacy.  [VK]    Clinical Course User Index [VK] Rexford Maus, DO                                 Medical Decision Making This patient presents to the ED with chief complaint(s) of shortness of breath, fatigue with pertinent past medical history of hyperlipidemia, recent foot surgery which further complicates the presenting complaint. The complaint involves an extensive differential diagnosis and also carries with it a high risk of complications and morbidity.    The differential diagnosis  includes considering PE with recent surgery and immobilization, anemia, arrhythmia, dehydration, electrolyte abnormality, pulmonary edema, pleural effusion, pneumonia, pneumothorax  Additional history obtained: Additional history obtained from spouse Records reviewed N/A  ED Course and Reassessment: On patient's arrival he is hemodynamically stable in no acute distress.  EKG showed normal sinus rhythm with right bundle branch block, no previous EKGs for comparison.  Patient will have labs including troponin, BNP and D-dimer as well as chest x-ray performed and he will be closely reassessed.  Independent labs interpretation:  The following labs were independently interpreted: Hgb 7.6 from baseline 14 1 year ago, anemia labs consistent with iron deficiency, hemoccult negative stools, labs otherwise within normal range  Independent visualization of imaging: - I independently visualized the following imaging with scope of interpretation limited to determining acute life threatening conditions related to emergency care: CXR, which revealed no acute disease  Consultation: - Consulted or discussed management/test interpretation w/ external professional: N/A  Consideration for admission or further workup: Patient has no emergent conditions requiring admission or further work-up at this time and is stable for discharge home with primary care follow-up  Social Determinants of health: N/A    Amount and/or Complexity of Data Reviewed Labs: ordered. Radiology: ordered.  Risk OTC drugs. Prescription drug management.          Final Clinical Impression(s) / ED Diagnoses Final diagnoses:  Symptomatic anemia  Iron deficiency anemia, unspecified iron deficiency anemia type    Rx / DC Orders ED Discharge Orders          Ordered    ferrous sulfate 325 (65 FE) MG tablet  Daily        02/15/23 2052              Rexford Maus, DO 02/15/23 2311

## 2023-02-17 LAB — TYPE AND SCREEN
ABO/RH(D): A POS
Antibody Screen: NEGATIVE
Unit division: 0

## 2023-02-17 LAB — BPAM RBC
Blood Product Expiration Date: 202501292359
ISSUE DATE / TIME: 202412282210
Unit Type and Rh: 6200

## 2023-02-21 DIAGNOSIS — H401222 Low-tension glaucoma, left eye, moderate stage: Secondary | ICD-10-CM | POA: Diagnosis not present

## 2023-02-24 ENCOUNTER — Ambulatory Visit (INDEPENDENT_AMBULATORY_CARE_PROVIDER_SITE_OTHER): Payer: PPO | Admitting: Family Medicine

## 2023-02-24 ENCOUNTER — Encounter: Payer: Self-pay | Admitting: Family Medicine

## 2023-02-24 VITALS — BP 128/82 | HR 52 | Temp 97.6°F | Ht 73.0 in | Wt 229.4 lb

## 2023-02-24 DIAGNOSIS — D508 Other iron deficiency anemias: Secondary | ICD-10-CM | POA: Diagnosis not present

## 2023-02-24 DIAGNOSIS — K2100 Gastro-esophageal reflux disease with esophagitis, without bleeding: Secondary | ICD-10-CM | POA: Diagnosis not present

## 2023-02-24 DIAGNOSIS — D649 Anemia, unspecified: Secondary | ICD-10-CM | POA: Insufficient documentation

## 2023-02-24 DIAGNOSIS — Z79899 Other long term (current) drug therapy: Secondary | ICD-10-CM | POA: Diagnosis not present

## 2023-02-24 DIAGNOSIS — E785 Hyperlipidemia, unspecified: Secondary | ICD-10-CM

## 2023-02-24 DIAGNOSIS — D539 Nutritional anemia, unspecified: Secondary | ICD-10-CM | POA: Insufficient documentation

## 2023-02-24 LAB — CBC WITH DIFFERENTIAL/PLATELET
Basophils Absolute: 0.1 10*3/uL (ref 0.0–0.1)
Basophils Relative: 0.7 % (ref 0.0–3.0)
Eosinophils Absolute: 0.7 10*3/uL (ref 0.0–0.7)
Eosinophils Relative: 9.4 % — ABNORMAL HIGH (ref 0.0–5.0)
HCT: 27.5 % — ABNORMAL LOW (ref 39.0–52.0)
Hemoglobin: 8.6 g/dL — ABNORMAL LOW (ref 13.0–17.0)
Lymphocytes Relative: 28.3 % (ref 12.0–46.0)
Lymphs Abs: 2.1 10*3/uL (ref 0.7–4.0)
MCHC: 31.1 g/dL (ref 30.0–36.0)
MCV: 77.3 fL — ABNORMAL LOW (ref 78.0–100.0)
Monocytes Absolute: 0.5 10*3/uL (ref 0.1–1.0)
Monocytes Relative: 7.3 % (ref 3.0–12.0)
Neutro Abs: 3.9 10*3/uL (ref 1.4–7.7)
Neutrophils Relative %: 54.3 % (ref 43.0–77.0)
Platelets: 384 10*3/uL (ref 150.0–400.0)
RBC: 3.56 Mil/uL — ABNORMAL LOW (ref 4.22–5.81)
RDW: 21.6 % — ABNORMAL HIGH (ref 11.5–15.5)
WBC: 7.2 10*3/uL (ref 4.0–10.5)

## 2023-02-24 LAB — COMPREHENSIVE METABOLIC PANEL
ALT: 17 U/L (ref 0–53)
AST: 16 U/L (ref 0–37)
Albumin: 4.3 g/dL (ref 3.5–5.2)
Alkaline Phosphatase: 58 U/L (ref 39–117)
BUN: 18 mg/dL (ref 6–23)
CO2: 26 meq/L (ref 19–32)
Calcium: 9.1 mg/dL (ref 8.4–10.5)
Chloride: 109 meq/L (ref 96–112)
Creatinine, Ser: 1.12 mg/dL (ref 0.40–1.50)
GFR: 66.97 mL/min (ref >60.00)
Glucose, Bld: 94 mg/dL (ref 70–99)
Potassium: 4.3 meq/L (ref 3.5–5.1)
Sodium: 141 meq/L (ref 135–145)
Total Bilirubin: 0.3 mg/dL (ref 0.2–1.2)
Total Protein: 6.8 g/dL (ref 6.0–8.3)

## 2023-02-24 MED ORDER — ROSUVASTATIN CALCIUM 20 MG PO TABS: 20.0000 mg | ORAL_TABLET | Freq: Every day | ORAL | 2 refills | Status: AC

## 2023-02-24 MED ORDER — EZETIMIBE 10 MG PO TABS: 10.0000 mg | ORAL_TABLET | Freq: Every day | ORAL | 1 refills | Status: DC

## 2023-02-24 NOTE — Patient Instructions (Addendum)
 We are checking labs today, will be in contact with any results that require further attention.   Pepcid twice a day.  Do not take iron with calcium . It will block the absorption of the iron. May take the iron with some vitamin C. This will help your body use the iron.  No ibuprofen, no aleve, no aspirin, no celebrex  or other NSAIDS.  May take tylenol  as needed.  Keep your appointment with Dr. Joshua for your physical.  After we get your labs back, I will let you know if we need to recheck your hemoglobin sooner than that.  If you are experiencing any new or worsening symptoms, please follow-up as needed.  If you begin to have worsening shortness of breath, have any palpitations, increased fatigue or dizziness, go straight to the ER for further evaluation and follow up.

## 2023-02-24 NOTE — Progress Notes (Signed)
 Acute Office Visit  Subjective:     Patient ID: Jerry Martin, male    DOB: [birth year], 68 y.o.   MRN: [id number]  Chief Complaint  Patient presents with   Medical Management of Chronic Issues    Hospital follow up, feeling better, pt stated he needed rosuvastatin . Pt stated his hemoglobin and iron was low base on the labs from ER, wants recheck     HPI Follow up ER visit  Patient was seen in ER for SOB, fatigue on 02/15/2023. He was treated for symptomatic anemia. Treatment for this included blood transfusion. He reports compliance with treatment. He reports this condition is Improved. Reports that the episode began with palpitations, feeling dizzy, fatigued and short of breath. They did not find a cause of anemia.  He has been compliant intake iron supplementation. Has not had any similar episodes since his visit with the ER. Reports that he has been hiking quite a lot of ibuprofen, cannot quantify. Reports worsening of GERD symptoms, has been taking Pepcid for this with mild improvement.  States that he has recently slowed down on taking ibuprofen and that GERD symptoms are beginning to improve over the last 2 days. Denies melena, hematuria, black tarry stools, hematemesis, other symptoms today. Requesting refills of cholesterol medications today.  -----------------------------------------------------------------------------------------     ROS Per HPI      Objective:    BP 128/82   Pulse (!) 52   Temp 97.6 F (36.4 C) (Temporal)   Ht 6' 1 (1.854 m)   Wt 229 lb 6 oz (104 kg)   BMI 30.26 kg/m    Physical Exam Vitals and nursing note reviewed.  Constitutional:      General: He is not in acute distress.    Appearance: Normal appearance.     Comments: Appears fatigued  HENT:     Head: Normocephalic and atraumatic.  Eyes:     Extraocular Movements: Extraocular movements intact.  Cardiovascular:     Rate and Rhythm: Normal rate and regular rhythm.      Pulses: Normal pulses.     Heart sounds: Normal heart sounds.  Pulmonary:     Effort: Pulmonary effort is normal. No respiratory distress.     Breath sounds: Normal breath sounds.  Musculoskeletal:        General: Normal range of motion.     Cervical back: Normal range of motion.  Lymphadenopathy:     Cervical: No cervical adenopathy.  Skin:    General: Skin is warm and dry.     Capillary Refill: Capillary refill takes 2 to 3 seconds.     Coloration: Skin is pale.     Comments: No cyanosis noted  Neurological:     General: No focal deficit present.     Mental Status: He is alert and oriented to person, place, and time.  Psychiatric:        Mood and Affect: Mood normal.        Behavior: Behavior normal.     No results found for any visits on 02/24/23.      Assessment & Plan:  1. Other iron deficiency anemia (Primary)  - CBC with Differential/Platelet - Comprehensive metabolic panel - Ambulatory referral to Gastroenterology -Continue iron supplementation  2. Hyperlipidemia with target LDL less than 130  - rosuvastatin  (CRESTOR ) 20 MG tablet; Take 1 tablet (20 mg total) by mouth daily.  Dispense: 90 tablet; Refill: 2 - ezetimibe  (ZETIA ) 10 MG tablet; Take 1 tablet (10 mg total) by  mouth daily.  Dispense: 90 tablet; Refill: 1  3. Gastroesophageal reflux disease with esophagitis, unspecified whether hemorrhage  - Ambulatory referral to Gastroenterology -May take Pepcid twice a day  4. Medication management  - rosuvastatin  (CRESTOR ) 20 MG tablet; Take 1 tablet (20 mg total) by mouth daily.  Dispense: 90 tablet; Refill: 2 - ezetimibe  (ZETIA ) 10 MG tablet; Take 1 tablet (10 mg total) by mouth daily.  Dispense: 90 tablet; Refill: 1   -Discussed in depth, symptoms to look out for with anemia -Discussed diet for anemia as well as reflux -Discussed when to seek more acute care with worsening shortness of breath, worsening fatigue, palpitations, sense of impending doom, other  concerning symptoms: Go straight to the ER -Discussed that we suspect the ibuprofen use has caused an oozing ulcer, referral to GI for further evaluation -Education handouts given for iron rich foods as well as low acid foods  Meds ordered this encounter  Medications   rosuvastatin  (CRESTOR ) 20 MG tablet    Sig: Take 1 tablet (20 mg total) by mouth daily.    Dispense:  90 tablet    Refill:  2   ezetimibe  (ZETIA ) 10 MG tablet    Sig: Take 1 tablet (10 mg total) by mouth daily.    Dispense:  90 tablet    Refill:  1    Please call office to schedule overdue appt with Dr Alveta for further refills. [phone] Thank you 3rd & Final Attempt    Return if symptoms worsen or fail to improve.  Corean Ku, FNP

## 2023-02-25 ENCOUNTER — Other Ambulatory Visit: Payer: Self-pay | Admitting: Cardiovascular Disease

## 2023-02-25 DIAGNOSIS — Z79899 Other long term (current) drug therapy: Secondary | ICD-10-CM

## 2023-02-25 DIAGNOSIS — E785 Hyperlipidemia, unspecified: Secondary | ICD-10-CM

## 2023-02-26 ENCOUNTER — Encounter: Payer: Self-pay | Admitting: Family Medicine

## 2023-03-12 ENCOUNTER — Encounter: Payer: Self-pay | Admitting: Internal Medicine

## 2023-03-12 ENCOUNTER — Ambulatory Visit: Payer: PPO | Admitting: Internal Medicine

## 2023-03-12 VITALS — BP 138/82 | HR 53 | Temp 97.4°F | Resp 16 | Ht 73.0 in | Wt 229.2 lb

## 2023-03-12 DIAGNOSIS — D539 Nutritional anemia, unspecified: Secondary | ICD-10-CM | POA: Diagnosis not present

## 2023-03-12 DIAGNOSIS — Z0001 Encounter for general adult medical examination with abnormal findings: Secondary | ICD-10-CM | POA: Insufficient documentation

## 2023-03-12 DIAGNOSIS — D509 Iron deficiency anemia, unspecified: Secondary | ICD-10-CM

## 2023-03-12 DIAGNOSIS — K2101 Gastro-esophageal reflux disease with esophagitis, with bleeding: Secondary | ICD-10-CM | POA: Diagnosis not present

## 2023-03-12 DIAGNOSIS — I10 Essential (primary) hypertension: Secondary | ICD-10-CM

## 2023-03-12 DIAGNOSIS — R001 Bradycardia, unspecified: Secondary | ICD-10-CM

## 2023-03-12 DIAGNOSIS — E785 Hyperlipidemia, unspecified: Secondary | ICD-10-CM | POA: Diagnosis not present

## 2023-03-12 DIAGNOSIS — Z0000 Encounter for general adult medical examination without abnormal findings: Secondary | ICD-10-CM | POA: Diagnosis not present

## 2023-03-12 DIAGNOSIS — Z23 Encounter for immunization: Secondary | ICD-10-CM

## 2023-03-12 DIAGNOSIS — N400 Enlarged prostate without lower urinary tract symptoms: Secondary | ICD-10-CM

## 2023-03-12 LAB — IBC + FERRITIN
Ferritin: 15.4 ng/mL — ABNORMAL LOW (ref 22.0–322.0)
Iron: 27 ug/dL — ABNORMAL LOW (ref 42–165)
Saturation Ratios: 5.8 % — ABNORMAL LOW (ref 20.0–50.0)
TIBC: 466.2 ug/dL — ABNORMAL HIGH (ref 250.0–450.0)
Transferrin: 333 mg/dL (ref 212.0–360.0)

## 2023-03-12 LAB — LIPID PANEL
Cholesterol: 120 mg/dL (ref 0–200)
HDL: 46.7 mg/dL (ref >39.00)
LDL Cholesterol: 48 mg/dL (ref 0–99)
NonHDL: 73.39
Total CHOL/HDL Ratio: 3
Triglycerides: 125 mg/dL (ref 0.0–149.0)
VLDL: 25 mg/dL (ref 0.0–40.0)

## 2023-03-12 LAB — TSH: TSH: 2.13 u[IU]/mL (ref 0.35–5.50)

## 2023-03-12 MED ORDER — ESOMEPRAZOLE MAGNESIUM 40 MG PO CPDR: 40.0000 mg | DELAYED_RELEASE_CAPSULE | Freq: Every day | ORAL | 1 refills | Status: DC

## 2023-03-12 NOTE — Patient Instructions (Signed)
Health Maintenance, Male Adopting a healthy lifestyle and getting preventive care are important in promoting health and wellness. Ask your health care provider about: The right schedule for you to have regular tests and exams. Things you can do on your own to prevent diseases and keep yourself healthy. What should I know about diet, weight, and exercise? Eat a healthy diet  Eat a diet that includes plenty of vegetables, fruits, low-fat dairy products, and lean protein. Do not eat a lot of foods that are high in solid fats, added sugars, or sodium. Maintain a healthy weight Body mass index (BMI) is a measurement that can be used to identify possible weight problems. It estimates body fat based on height and weight. Your health care provider can help determine your BMI and help you achieve or maintain a healthy weight. Get regular exercise Get regular exercise. This is one of the most important things you can do for your health. Most adults should: Exercise for at least 150 minutes each week. The exercise should increase your heart rate and make you sweat (moderate-intensity exercise). Do strengthening exercises at least twice a week. This is in addition to the moderate-intensity exercise. Spend less time sitting. Even light physical activity can be beneficial. Watch cholesterol and blood lipids Have your blood tested for lipids and cholesterol at 70 years of age, then have this test every 5 years. You may need to have your cholesterol levels checked more often if: Your lipid or cholesterol levels are high. You are older than 70 years of age. You are at high risk for heart disease. What should I know about cancer screening? Many types of cancers can be detected early and may often be prevented. Depending on your health history and family history, you may need to have cancer screening at various ages. This may include screening for: Colorectal cancer. Prostate cancer. Skin cancer. Lung  cancer. What should I know about heart disease, diabetes, and high blood pressure? Blood pressure and heart disease High blood pressure causes heart disease and increases the risk of stroke. This is more likely to develop in people who have high blood pressure readings or are overweight. Talk with your health care provider about your target blood pressure readings. Have your blood pressure checked: Every 3-5 years if you are 18-39 years of age. Every year if you are 40 years old or older. If you are between the ages of 65 and 75 and are a current or former smoker, ask your health care provider if you should have a one-time screening for abdominal aortic aneurysm (AAA). Diabetes Have regular diabetes screenings. This checks your fasting blood sugar level. Have the screening done: Once every three years after age 45 if you are at a normal weight and have a low risk for diabetes. More often and at a younger age if you are overweight or have a high risk for diabetes. What should I know about preventing infection? Hepatitis B If you have a higher risk for hepatitis B, you should be screened for this virus. Talk with your health care provider to find out if you are at risk for hepatitis B infection. Hepatitis C Blood testing is recommended for: Everyone born from 1945 through 1965. Anyone with known risk factors for hepatitis C. Sexually transmitted infections (STIs) You should be screened each year for STIs, including gonorrhea and chlamydia, if: You are sexually active and are younger than 70 years of age. You are older than 70 years of age and your   health care provider tells you that you are at risk for this type of infection. Your sexual activity has changed since you were last screened, and you are at increased risk for chlamydia or gonorrhea. Ask your health care provider if you are at risk. Ask your health care provider about whether you are at high risk for HIV. Your health care provider  may recommend a prescription medicine to help prevent HIV infection. If you choose to take medicine to prevent HIV, you should first get tested for HIV. You should then be tested every 3 months for as long as you are taking the medicine. Follow these instructions at home: Alcohol use Do not drink alcohol if your health care provider tells you not to drink. If you drink alcohol: Limit how much you have to 0-2 drinks a day. Know how much alcohol is in your drink. In the [place] one drink equals one 12 oz bottle of beer (355 mL), one 5 oz glass of wine (148 mL), or one 1 oz glass of hard liquor (44 mL). Lifestyle Do not use any products that contain nicotine or tobacco. These products include cigarettes, chewing tobacco, and vaping devices, such as e-cigarettes. If you need help quitting, ask your health care provider. Do not use street drugs. Do not share needles. Ask your health care provider for help if you need support or information about quitting drugs. General instructions Schedule regular health, dental, and eye exams. Stay current with your vaccines. Tell your health care provider if: You often feel depressed. You have ever been abused or do not feel safe at home. Summary Adopting a healthy lifestyle and getting preventive care are important in promoting health and wellness. Follow your health care provider's instructions about healthy diet, exercising, and getting tested or screened for diseases. Follow your health care provider's instructions on monitoring your cholesterol and blood pressure. This information is not intended to replace advice given to you by your health care provider. Make sure you discuss any questions you have with your health care provider. Document Revised: 06/26/2020 Document Reviewed: 06/26/2020 Elsevier Patient Education  2024 [employer]  

## 2023-03-12 NOTE — Progress Notes (Unsigned)
Subjective:  Patient ID: Jerry Martin, male    DOB: 1955-03-13  Age: 68 y.o. MRN: [id number]  CC: Anemia, Annual Exam, Hyperlipidemia, and Gastroesophageal Reflux   HPI Jerry Martin presents for a CPX and f/up ----  Discussed the use of AI scribe software for clinical note transcription with the patient, who gave verbal consent to proceed.  History of Present Illness   The patient, with a history of recent foot surgery, presented with fatigue and shortness of breath. He reported being sedentary for approximately eight weeks due to the surgery, which he initially attributed to his fatigue. However, the fatigue persisted even after resuming activity. During this period, he also experienced a significant amount of stress due to the decline and eventual passing of his father.  The patient experienced a panic attack during a show, characterized by shortness of breath, dizziness, and feeling woozy. He was taken to the ER where he was found to have low hemoglobin and iron levels, necessitating a blood transfusion. Post-transfusion, the patient reported feeling better.  In the follow-up appointment with the PA, the patient's hemoglobin levels were found to be improving. The patient admitted to consuming ibuprofen frequently for two months, which he stopped after the consultation with the PA. Since then, the patient reported feeling better each day and returning to normal.  The patient denied any abdominal pain, chest pain, shortness of breath, or dizziness at the time of the consultation. He reported that his fatigue was improving and he was regaining his endurance. He also reported no issues with urination.  The patient had a history of heartburn and was taking Pepcid occasionally. He denied any painful or trouble swallowing but reported occasional 'afterburn' after eating. He had stopped taking ibuprofen and was occasionally taking acetaminophen for pain.  The patient was also taking iron supplements  and two medications, Zetia and rosuvastatin, to lower cholesterol. He denied any side effects from these medications. The patient was a nonsmoker and nondrinker.       Outpatient Medications Prior to Visit  Medication Sig Dispense Refill  . brimonidine (ALPHAGAN) 0.2 % ophthalmic solution Place 1 drop into the left eye 2 (two) times daily.    . celecoxib (CELEBREX) 100 MG capsule TAKE 1 CAPSULE(100 MG) BY MOUTH DAILY 90 capsule 0  . ezetimibe (ZETIA) 10 MG tablet Take 1 tablet (10 mg total) by mouth daily. 90 tablet 1  . ferrous sulfate 325 (65 FE) MG tablet Take 1 tablet (325 mg total) by mouth daily. 30 tablet 0  . Multiple Vitamin (MULTIVITAMIN) capsule Take 1 capsule by mouth daily.    . rosuvastatin (CRESTOR) 20 MG tablet Take 1 tablet (20 mg total) by mouth daily. 90 tablet 2   No facility-administered medications prior to visit.    ROS Review of Systems  Constitutional:  Positive for unexpected weight change (wt gain). Negative for appetite change, chills, diaphoresis and fatigue.  HENT: Negative.  Negative for sore throat, trouble swallowing and voice change.   Eyes: Negative.   Respiratory:  Negative for cough, chest tightness, shortness of breath and wheezing.   Cardiovascular:  Negative for chest pain, palpitations and leg swelling.  Gastrointestinal: Negative.  Negative for abdominal pain, constipation, diarrhea, nausea and vomiting.  Endocrine: Negative.   Genitourinary: Negative.  Negative for difficulty urinating, dysuria and hematuria.  Musculoskeletal:  Negative for arthralgias, joint swelling and myalgias.  Skin:  Positive for pallor.  Neurological: Negative.  Negative for dizziness and weakness.  Hematological:  Negative for adenopathy.  Does not bruise/bleed easily.  Psychiatric/Behavioral: Negative.      Objective:  BP 138/82 (BP Location: Left Arm, Patient Position: Sitting, Cuff Size: Normal) Comment: BP (R) 140/78 (L) 138/82  Pulse (!) 53   Temp (!) 97.4 F  (36.3 C) (Oral)   Resp 16   Ht 6\' 1"  (1.854 m)   Wt 229 lb 3.2 oz (104 kg)   SpO2 100%   BMI 30.24 kg/m   BP Readings from Last 3 Encounters:  03/12/23 138/82  02/24/23 128/82  02/16/23 123/73    Wt Readings from Last 3 Encounters:  03/12/23 229 lb 3.2 oz (104 kg)  02/24/23 229 lb 6 oz (104 kg)  02/15/23 215 lb (97.5 kg)    Physical Exam Vitals reviewed.  Constitutional:      Appearance: Normal appearance.  HENT:     Nose: Nose normal.     Mouth/Throat:     Mouth: Mucous membranes are moist.  Eyes:     General: No scleral icterus.    Conjunctiva/sclera: Conjunctivae normal.  Cardiovascular:     Rate and Rhythm: Normal rate and regular rhythm.     Heart sounds: No murmur heard.    No friction rub. No gallop.  Pulmonary:     Effort: Pulmonary effort is normal.     Breath sounds: No stridor. No wheezing, rhonchi or rales.  Abdominal:     General: Abdomen is flat.     Palpations: There is no mass.     Tenderness: There is no abdominal tenderness. There is no guarding.     Hernia: No hernia is present. There is no hernia in the left inguinal area or right inguinal area.  Genitourinary:    Pubic Area: No rash.      Penis: Normal and circumcised.      Testes: Normal.     Epididymis:     Right: Normal.     Left: Normal.     Prostate: Enlarged. Not tender and no nodules present.     Rectum: Normal. Guaiac result negative. No mass, tenderness, anal fissure, external hemorrhoid or internal hemorrhoid. Normal anal tone.  Musculoskeletal:        General: Normal range of motion.     Cervical back: Normal range of motion and neck supple.     Right lower leg: No edema.     Left lower leg: No edema.  Lymphadenopathy:     Cervical: No cervical adenopathy.     Lower Body: No right inguinal adenopathy. No left inguinal adenopathy.  Skin:    Coloration: Skin is pale.     Findings: No rash.  Neurological:     General: No focal deficit present.     Mental Status: He is  alert. Mental status is at baseline.  Psychiatric:        Mood and Affect: Mood normal.        Behavior: Behavior normal.    Lab Results  Component Value Date   WBC 5.7 03/13/2023   HGB 9.9 (L) 03/13/2023   HCT 32.1 (L) 03/13/2023   PLT 360.0 03/13/2023   GLUCOSE 94 02/24/2023   CHOL 120 03/12/2023   TRIG 125.0 03/12/2023   HDL 46.70 03/12/2023   LDLCALC 48 03/12/2023   ALT 17 02/24/2023   AST 16 02/24/2023   NA 141 02/24/2023   K 4.3 02/24/2023   CL 109 02/24/2023   CREATININE 1.12 02/24/2023   BUN 18 02/24/2023   CO2 26 02/24/2023   TSH 2.13  03/12/2023   PSA 0.41 03/13/2023    DG Chest 2 View Result Date: 02/15/2023 CLINICAL DATA:  Shortness of breath. EXAM: CHEST - 2 VIEW COMPARISON:  None Available. FINDINGS: Bilateral lung fields are clear. Bilateral costophrenic angles are clear. Normal cardio-mediastinal silhouette. Small retrocardiac hiatal hernia noted. No acute osseous abnormalities. The soft tissues are within normal limits. IMPRESSION: No active cardiopulmonary disease. Electronically Signed   By: Jules Schick M.D.   On: 02/15/2023 17:29    Assessment & Plan:  Deficiency anemia -     Vitamin B1; Future -     Zinc; Future -     IBC + Ferritin; Future -     CBC with Differential/Platelet; Future  Need for immunization against influenza -     Flu Vaccine Trivalent High Dose (Fluad)  Chronic sinus bradycardia -     TSH; Future  Encounter for general adult medical examination with abnormal findings - Exam completed, labs reviewed, vaccines reviewed and updated, cancer screenings addressed, pt ed material was given.   Gastroesophageal reflux disease with esophagitis and hemorrhage- Will start a PPI. -     Esomeprazole Magnesium; Take 1 capsule (40 mg total) by mouth daily.  Dispense: 90 capsule; Refill: 1 -     CBC with Differential/Platelet; Future  Hyperlipidemia with target LDL less than 130 - LDL goal achieved. Doing well on the statin  -     Lipid  panel; Future -     TSH; Future  Primary hypertension- His BP is well controlled. -     TSH; Future  Iron deficiency anemia, unspecified iron deficiency anemia type -     Ambulatory referral to Hematology / Oncology  Benign prostatic hyperplasia without lower urinary tract symptoms -     PSA; Future     Follow-up: Return in about 3 months (around 06/10/2023).  Sanda Linger, MD

## 2023-03-13 ENCOUNTER — Other Ambulatory Visit (INDEPENDENT_AMBULATORY_CARE_PROVIDER_SITE_OTHER): Payer: PPO

## 2023-03-13 ENCOUNTER — Encounter: Payer: Self-pay | Admitting: Internal Medicine

## 2023-03-13 DIAGNOSIS — K2101 Gastro-esophageal reflux disease with esophagitis, with bleeding: Secondary | ICD-10-CM

## 2023-03-13 DIAGNOSIS — D539 Nutritional anemia, unspecified: Secondary | ICD-10-CM | POA: Diagnosis not present

## 2023-03-13 DIAGNOSIS — N400 Enlarged prostate without lower urinary tract symptoms: Secondary | ICD-10-CM | POA: Diagnosis not present

## 2023-03-13 LAB — CBC WITH DIFFERENTIAL/PLATELET
Basophils Absolute: 0.1 10*3/uL (ref 0.0–0.1)
Basophils Relative: 1 % (ref 0.0–3.0)
Eosinophils Absolute: 0.6 10*3/uL (ref 0.0–0.7)
Eosinophils Relative: 9.6 % — ABNORMAL HIGH (ref 0.0–5.0)
HCT: 32.1 % — ABNORMAL LOW (ref 39.0–52.0)
Hemoglobin: 9.9 g/dL — ABNORMAL LOW (ref 13.0–17.0)
Lymphocytes Relative: 27.3 % (ref 12.0–46.0)
Lymphs Abs: 1.6 10*3/uL (ref 0.7–4.0)
MCHC: 30.7 g/dL (ref 30.0–36.0)
MCV: 80 fL (ref 78.0–100.0)
Monocytes Absolute: 0.6 10*3/uL (ref 0.1–1.0)
Monocytes Relative: 9.8 % (ref 3.0–12.0)
Neutro Abs: 3 10*3/uL (ref 1.4–7.7)
Neutrophils Relative %: 52.3 % (ref 43.0–77.0)
Platelets: 360 10*3/uL (ref 150.0–400.0)
RBC: 4.01 Mil/uL — ABNORMAL LOW (ref 4.22–5.81)
RDW: 23.5 % — ABNORMAL HIGH (ref 11.5–15.5)
WBC: 5.7 10*3/uL (ref 4.0–10.5)

## 2023-03-13 LAB — PSA: PSA: 0.41 ng/mL (ref 0.10–4.00)

## 2023-03-17 LAB — VITAMIN B1: Vitamin B1 (Thiamine): 12 nmol/L (ref 8–30)

## 2023-03-17 LAB — ZINC: Zinc: 74 ug/dL (ref 60–130)

## 2023-03-27 ENCOUNTER — Emergency Department (HOSPITAL_BASED_OUTPATIENT_CLINIC_OR_DEPARTMENT_OTHER): Payer: PPO | Admitting: Radiology

## 2023-03-27 ENCOUNTER — Encounter (HOSPITAL_BASED_OUTPATIENT_CLINIC_OR_DEPARTMENT_OTHER): Payer: Self-pay | Admitting: Emergency Medicine

## 2023-03-27 ENCOUNTER — Other Ambulatory Visit: Payer: Self-pay

## 2023-03-27 ENCOUNTER — Emergency Department (HOSPITAL_BASED_OUTPATIENT_CLINIC_OR_DEPARTMENT_OTHER): Payer: PPO

## 2023-03-27 ENCOUNTER — Emergency Department (HOSPITAL_BASED_OUTPATIENT_CLINIC_OR_DEPARTMENT_OTHER)
Admission: EM | Admit: 2023-03-27 | Discharge: 2023-03-27 | Discharge status: Against Medical Advice | Payer: PPO | Attending: Emergency Medicine | Admitting: Emergency Medicine

## 2023-03-27 DIAGNOSIS — Z5321 Procedure and treatment not carried out due to patient leaving prior to being seen by health care provider: Secondary | ICD-10-CM | POA: Insufficient documentation

## 2023-03-27 DIAGNOSIS — M1712 Unilateral primary osteoarthritis, left knee: Secondary | ICD-10-CM | POA: Diagnosis not present

## 2023-03-27 DIAGNOSIS — M7989 Other specified soft tissue disorders: Secondary | ICD-10-CM | POA: Diagnosis not present

## 2023-03-27 DIAGNOSIS — L539 Erythematous condition, unspecified: Secondary | ICD-10-CM | POA: Diagnosis not present

## 2023-03-27 DIAGNOSIS — M25562 Pain in left knee: Secondary | ICD-10-CM | POA: Diagnosis not present

## 2023-03-27 LAB — CBC WITH DIFFERENTIAL/PLATELET
Abs Immature Granulocytes: 0.02 10*3/uL (ref 0.00–0.07)
Basophils Absolute: 0.1 10*3/uL (ref 0.0–0.1)
Basophils Relative: 1 %
Eosinophils Absolute: 0.4 10*3/uL (ref 0.0–0.5)
Eosinophils Relative: 5 %
HCT: 39 % (ref 39.0–52.0)
Hemoglobin: 11.2 g/dL — ABNORMAL LOW (ref 13.0–17.0)
Immature Granulocytes: 0 %
Lymphocytes Relative: 28 %
Lymphs Abs: 2.2 10*3/uL (ref 0.7–4.0)
MCH: 24.6 pg — ABNORMAL LOW (ref 26.0–34.0)
MCHC: 28.7 g/dL — ABNORMAL LOW (ref 30.0–36.0)
MCV: 85.5 fL (ref 80.0–100.0)
Monocytes Absolute: 0.5 10*3/uL (ref 0.1–1.0)
Monocytes Relative: 7 %
Neutro Abs: 4.4 10*3/uL (ref 1.7–7.7)
Neutrophils Relative %: 59 %
Platelets: 274 10*3/uL (ref 150–400)
RBC: 4.56 MIL/uL (ref 4.22–5.81)
RDW: 20.9 % — ABNORMAL HIGH (ref 11.5–15.5)
WBC: 7.6 10*3/uL (ref 4.0–10.5)
nRBC: 0 % (ref 0.0–0.2)

## 2023-03-27 LAB — BASIC METABOLIC PANEL
Anion gap: 10 (ref 5–15)
BUN: 15 mg/dL (ref 8–23)
CO2: 22 mmol/L (ref 22–32)
Calcium: 9.4 mg/dL (ref 8.9–10.3)
Chloride: 108 mmol/L (ref 98–111)
Creatinine, Ser: 1.03 mg/dL (ref 0.61–1.24)
GFR, Estimated: >60 mL/min (ref >60)
Glucose, Bld: 99 mg/dL (ref 70–99)
Potassium: 4 mmol/L (ref 3.5–5.1)
Sodium: 140 mmol/L (ref 135–145)

## 2023-03-27 NOTE — ED Triage Notes (Signed)
 C/o left knee pain w/o known injury. Knee appears red and warmer to touch. Denies pain outside of joint area.

## 2023-03-27 NOTE — ED Notes (Signed)
 Pt stated he was going to see his Hematologist tmr and doe snot want to stay any longer.

## 2023-03-27 NOTE — ED Provider Triage Note (Signed)
 Emergency Medicine Provider Triage Evaluation Note  Rangel Echeverri , a 68 y.o. male  was evaluated in triage.  Pt complains of concern for DVT. No chest pain or dyspnea. Pain predominantly in left knee. Red and warm.  Review of Systems  Positive: As above Negative: As above  Physical Exam  Ht 6' 1 (1.854 m)   Wt 103 kg   BMI 29.96 kg/m  Gen:   Awake, no distress   Resp:  Normal effort  MSK:   Moves extremities without difficulty Other:    Medical Decision Making  Medically screening exam initiated at 5:13 PM.  Appropriate orders placed.  Jeven Houchins was informed that the remainder of the evaluation will be completed by another provider, this initial triage assessment does not replace that evaluation, and the importance of remaining in the ED until their evaluation is complete.    Hildegard Loge, PA-C 03/27/23 1714

## 2023-03-28 ENCOUNTER — Encounter: Payer: Self-pay | Admitting: Oncology

## 2023-03-28 ENCOUNTER — Inpatient Hospital Stay: Payer: PPO

## 2023-03-28 ENCOUNTER — Inpatient Hospital Stay: Payer: PPO | Attending: Oncology | Admitting: Oncology

## 2023-03-28 VITALS — BP 121/64 | HR 54 | Temp 98.0°F | Resp 17 | Ht 73.0 in | Wt 230.4 lb

## 2023-03-28 DIAGNOSIS — Z720 Tobacco use: Secondary | ICD-10-CM | POA: Insufficient documentation

## 2023-03-28 DIAGNOSIS — M47812 Spondylosis without myelopathy or radiculopathy, cervical region: Secondary | ICD-10-CM | POA: Diagnosis not present

## 2023-03-28 DIAGNOSIS — D500 Iron deficiency anemia secondary to blood loss (chronic): Secondary | ICD-10-CM | POA: Diagnosis not present

## 2023-03-28 LAB — CBC WITH DIFFERENTIAL (CANCER CENTER ONLY)
Abs Immature Granulocytes: 0.02 10*3/uL (ref 0.00–0.07)
Basophils Absolute: 0.1 10*3/uL (ref 0.0–0.1)
Basophils Relative: 1 %
Eosinophils Absolute: 0.7 10*3/uL — ABNORMAL HIGH (ref 0.0–0.5)
Eosinophils Relative: 11 %
HCT: 36.7 % — ABNORMAL LOW (ref 39.0–52.0)
Hemoglobin: 11.2 g/dL — ABNORMAL LOW (ref 13.0–17.0)
Immature Granulocytes: 0 %
Lymphocytes Relative: 33 %
Lymphs Abs: 2.2 10*3/uL (ref 0.7–4.0)
MCH: 24.6 pg — ABNORMAL LOW (ref 26.0–34.0)
MCHC: 30.5 g/dL (ref 30.0–36.0)
MCV: 80.5 fL (ref 80.0–100.0)
Monocytes Absolute: 0.5 10*3/uL (ref 0.1–1.0)
Monocytes Relative: 7 %
Neutro Abs: 3.2 10*3/uL (ref 1.7–7.7)
Neutrophils Relative %: 48 %
Platelet Count: 287 10*3/uL (ref 150–400)
RBC: 4.56 MIL/uL (ref 4.22–5.81)
RDW: 20.3 % — ABNORMAL HIGH (ref 11.5–15.5)
WBC Count: 6.6 10*3/uL (ref 4.0–10.5)
nRBC: 0 % (ref 0.0–0.2)

## 2023-03-28 LAB — CMP (CANCER CENTER ONLY)
ALT: 17 U/L (ref 0–44)
AST: 20 U/L (ref 15–41)
Albumin: 4.3 g/dL (ref 3.5–5.0)
Alkaline Phosphatase: 56 U/L (ref 38–126)
Anion gap: 6 (ref 5–15)
BUN: 16 mg/dL (ref 8–23)
CO2: 26 mmol/L (ref 22–32)
Calcium: 9.2 mg/dL (ref 8.9–10.3)
Chloride: 108 mmol/L (ref 98–111)
Creatinine: 1.14 mg/dL (ref 0.61–1.24)
GFR, Estimated: >60 mL/min (ref >60)
Glucose, Bld: 100 mg/dL — ABNORMAL HIGH (ref 70–99)
Potassium: 4.3 mmol/L (ref 3.5–5.1)
Sodium: 140 mmol/L (ref 135–145)
Total Bilirubin: 0.3 mg/dL (ref 0.0–1.2)
Total Protein: 6.9 g/dL (ref 6.5–8.1)

## 2023-03-28 LAB — IRON AND IRON BINDING CAPACITY (CC-WL,HP ONLY)
Iron: 18 ug/dL — ABNORMAL LOW (ref 45–182)
Saturation Ratios: 4 % — ABNORMAL LOW (ref 17.9–39.5)
TIBC: 435 ug/dL (ref 250–450)
UIBC: 417 ug/dL — ABNORMAL HIGH (ref 117–376)

## 2023-03-28 LAB — VITAMIN D 25 HYDROXY (VIT D DEFICIENCY, FRACTURES): Vit D, 25-Hydroxy: 46.98 ng/mL (ref 30–100)

## 2023-03-28 LAB — FOLATE: Folate: 11.9 ng/mL (ref >5.9)

## 2023-03-28 NOTE — Progress Notes (Signed)
 [facility]  HEMATOLOGY CLINIC CONSULTATION NOTE   PATIENT NAME: Jerry Martin   MR#: [id number] DOB: 02-02-1956  DATE OF SERVICE: 03/28/2023  Patient Care Team: Joshua Debby CROME, MD as PCP - General (Internal Medicine)  REASON FOR CONSULTATION/ CHIEF COMPLAINT:  Evaluation of iron deficiency anemia.  ASSESSMENT & PLAN:   Jerry Martin is a 68 y.o. gentleman with a past medical history of dyslipidemia, hypertension, BPH, GERD with esophagitis, hiatal hernia, was referred to our service for evaluation of iron deficiency anemia.    Iron deficiency anemia due to chronic blood loss Iron deficiency anemia likely secondary to chronic blood loss, possibly exacerbated by ibuprofen use post-foot surgery.   Hemoglobin improved from 7.6 in December to 11.2 recently.   Ferritin and iron saturation levels remain low. Patient tolerating oral iron well without side effects. Discussed potential need for IV iron infusion if oral iron is insufficient.   IV iron can stabilize levels for six months but may require repeat courses if ongoing blood loss occurs.   Risks of IV iron include infusion reactions, though less common with newer formulations.  Labs today showed hemoglobin of 11.2, MCV 80.5.  White count and platelet count are within normal limits.  Iron studies continue to show evidence of iron deficiency with iron saturation of 4%, iron decreased at 18.  Ferritin pending.  CMP unremarkable.  Will discuss with patient to see if he would like to proceed with IV iron and we will submit request for authorization accordingly.  Potential gastrointestinal blood loss contributing to iron deficiency anemia. History of ibuprofen use, which can cause gastrointestinal bleeding. Last colonoscopy was 15 years ago, with a Cologuard test done a couple of years ago. Referral to GI for further evaluation. Discussed possibility of polyps or iron absorption issues as causes. Colonoscopy and possibly upper  endoscopy recommended to identify sources of blood loss.  He currently has an appointment coming up with GI on 04/07/2023.  - Follow-up in three months for re-evaluation with repeat labs including iron studies.  Spondylosis of cervical region without myelopathy or radiculopathy Osteoarthritis with back pain managed with Celebrex . Discussed benefits of Celebrex  as a selective COX-2 inhibitor with fewer gastrointestinal side effects compared to other NSAIDs. Can be used as needed for pain management.   Since the cause of anemia seems to be obvious from iron deficiency, I am not pursuing extensive workup at this time.  If inadequate response to IV iron is noted, we will pursue workup to rule out other etiologies.  I reviewed lab results and outside records for this visit and discussed relevant results with the patient. Diagnosis, plan of care and treatment options were also discussed in detail with the patient. Opportunity provided to ask questions and answers provided to his apparent satisfaction. Provided instructions to call our clinic with any problems, questions or concerns prior to return visit. I recommended to continue follow-up with PCP and sub-specialists. He verbalized understanding and agreed with the plan. No barriers to learning was detected.  Jerry Josten, MD [facility] - A DEPT OF [facility] [address] [place] [address] Dept: [phone] Dept Fax: [phone]  03/28/2023 5:07 PM  HISTORY OF PRESENT ILLNESS:  Discussed the use of AI scribe software for clinical note transcription with the patient, who gave verbal consent to proceed.   On routine labs at his PCPs office on 03/12/2023, hemoglobin was 9.9, hematocrit 32.1, MCV  80.  White count 5700 with normal differential, platelet count normal at 360,000.  Vitamin B1, zinc , TSH, PSA were all within normal limits.  Iron studies showed evidence of iron  deficiency with ferritin of 15, iron saturation decreased at 5.8%, iron decreased at 27.  He was referred to us  for further evaluation and management of iron deficiency anemia.  The patient reported that he had foot surgery in September 2024 and had been taking a lot of ibuprofen during his recovery. In December, the patient experienced a significant drop in hemoglobin and iron levels, which led to a blood transfusion. The patient has since stopped taking ibuprofen and has been taking oral iron supplements. The patient reported feeling much better and his hemoglobin levels have been improving. The patient also mentioned that he has a hiatal hernia and takes Nexium  for it. He had a colonoscopy 15 years ago and a Cologuard test a couple of years ago. He has an upcoming appointment with a gastroenterologist.  He currently denies chest pain on exertion, shortness of breath on minimal exertion, pre-syncopal episodes, or palpitations.  He had not noticed any recent bleeding such as epistaxis, hematuria or hematochezia   MEDICAL HISTORY:  Past Medical History:  Diagnosis Date   GERD (gastroesophageal reflux disease)    Hypercholesteremia    Hypertension    RBBB     SURGICAL HISTORY: Past Surgical History:  Procedure Laterality Date   BUBBLE STUDY  12/21/2021   Procedure: BUBBLE STUDY;  Surgeon: Santo Stanly LABOR, MD;  Location: [facility] ENDOSCOPY;  Service: Cardiovascular;;   HERNIA REPAIR     TEE WITHOUT CARDIOVERSION N/A 12/21/2021   Procedure: TRANSESOPHAGEAL ECHOCARDIOGRAM (TEE);  Surgeon: Santo Stanly LABOR, MD;  Location: [facility] ENDOSCOPY;  Service: Cardiovascular;  Laterality: N/A;   UVULECTOMY      SOCIAL HISTORY: He reports that he has been smoking cigars. He has never been exposed to tobacco smoke. He has never used smokeless tobacco. He reports that he does not currently use alcohol. He reports current drug use. Drug: Marijuana. Social History   Socioeconomic History   Marital  status: Married    Spouse name: Not on file   Number of children: Not on file   Years of education: Not on file   Highest education level: Master's degree (e.g., MA, MS, MEng, MEd, MSW, MBA)  Occupational History   Not on file  Tobacco Use   Smoking status: Some Days    Types: Cigars    Passive exposure: Never   Smokeless tobacco: Never  Vaping Use   Vaping status: Never Used  Substance and Sexual Activity   Alcohol use: Not Currently    Alcohol/week: 0.0 standard drinks of alcohol   Drug use: Yes    Types: Marijuana   Sexual activity: Yes    Partners: Female  Other Topics Concern   Not on file  Social History Narrative   Not on file   Social Drivers of Health   Financial Resource Strain: Low Risk  (03/08/2023)   Overall Financial Resource Strain (CARDIA)    Difficulty of Paying Living Expenses: Not hard at all  Food Insecurity: No Food Insecurity (03/28/2023)   Hunger Vital Sign    Worried About Running Out of Food in the Last Year: Never true    Ran Out of Food in the Last Year: Never true  Transportation Needs: No Transportation Needs (03/28/2023)   PRAPARE - Administrator, Civil Service (Medical): No    Lack of Transportation (  Non-Medical): No  Physical Activity: Insufficiently Active (03/08/2023)   Exercise Vital Sign    Days of Exercise per Week: 4 days    Minutes of Exercise per Session: 30 min  Stress: Stress Concern Present (03/08/2023)   [employer] of Occupational Health - Occupational Stress Questionnaire    Feeling of Stress : To some extent  Social Connections: Socially Integrated (03/08/2023)   Social Connection and Isolation Panel [NHANES]    Frequency of Communication with Friends and Family: More than three times a week    Frequency of Social Gatherings with Friends and Family: Twice a week    Attends Religious Services: More than 4 times per year    Active Member of [employer] or Organizations: Yes    Attends Engineer, Structural:  More than 4 times per year    Marital Status: Married  Catering Manager Violence: Not At Risk (03/28/2023)   Humiliation, Afraid, Rape, and Kick questionnaire    Fear of Current or Ex-Partner: No    Emotionally Abused: No    Physically Abused: No    Sexually Abused: No    FAMILY HISTORY: Family History  Problem Relation Age of Onset   Hypertension Mother     ALLERGIES:  He is allergic to penicillins and irbesartan .  MEDICATIONS:  Current Outpatient Medications  Medication Sig Dispense Refill   brimonidine (ALPHAGAN) 0.2 % ophthalmic solution Place 1 drop into the left eye 2 (two) times daily.     celecoxib  (CELEBREX ) 100 MG capsule TAKE 1 CAPSULE(100 MG) BY MOUTH DAILY 90 capsule 0   esomeprazole  (NEXIUM ) 40 MG capsule Take 1 capsule (40 mg total) by mouth daily. 90 capsule 1   ezetimibe  (ZETIA ) 10 MG tablet Take 1 tablet (10 mg total) by mouth daily. 90 tablet 1   ferrous sulfate  325 (65 FE) MG tablet Take 1 tablet (325 mg total) by mouth daily. 30 tablet 0   Multiple Vitamin (MULTIVITAMIN) capsule Take 1 capsule by mouth daily.     rosuvastatin  (CRESTOR ) 20 MG tablet Take 1 tablet (20 mg total) by mouth daily. 90 tablet 2   SIMBRINZA 1-0.2 % SUSP Place 1 drop into the left eye 3 (three) times daily. Glaucoma     No current facility-administered medications for this visit.    REVIEW OF SYSTEMS:    Review of Systems - Oncology  All other pertinent systems were reviewed and were negative except as mentioned above.  PHYSICAL EXAMINATION:   Onc Performance Status - 03/28/23 1400       ECOG Perf Status   ECOG Perf Status Fully active, able to carry on all pre-disease performance without restriction      KPS SCALE   KPS % SCORE Normal, no compliants, no evidence of disease             Vitals:   03/28/23 1408  BP: 121/64  Pulse: (!) 54  Resp: 17  Temp: 98 F (36.7 C)  SpO2: 97%   Filed Weights   03/28/23 1408  Weight: 230 lb 6.4 oz (104.5 kg)    Physical  Exam Constitutional:      General: He is not in acute distress.    Appearance: Normal appearance.  HENT:     Head: Normocephalic and atraumatic.  Eyes:     General: No scleral icterus.    Conjunctiva/sclera: Conjunctivae normal.  Cardiovascular:     Rate and Rhythm: Normal rate and regular rhythm.     Heart sounds: Normal heart sounds.  Pulmonary:     Effort: Pulmonary effort is normal.     Breath sounds: Normal breath sounds.  Abdominal:     General: There is no distension.  Musculoskeletal:     Right lower leg: No edema.     Left lower leg: No edema.  Neurological:     General: No focal deficit present.     Mental Status: He is alert and oriented to person, place, and time.  Psychiatric:        Mood and Affect: Mood normal.        Behavior: Behavior normal.        Thought Content: Thought content normal.     LABORATORY DATA:   I have reviewed the data as listed.  Results for orders placed or performed in visit on 03/28/23  Iron and Iron Binding Capacity ([facility] only)  Result Value Ref Range   Iron 18 (L) 45 - 182 ug/dL   TIBC 564 749 - 549 ug/dL   Saturation Ratios 4 (L) 17.9 - 39.5 %   UIBC 417 (H) 117 - 376 ug/dL  CBC with Differential (Cancer Center Only)  Result Value Ref Range   WBC Count 6.6 4.0 - 10.5 K/uL   RBC 4.56 4.22 - 5.81 MIL/uL   Hemoglobin 11.2 (L) 13.0 - 17.0 g/dL   HCT 63.2 (L) 60.9 - 47.9 %   MCV 80.5 80.0 - 100.0 fL   MCH 24.6 (L) 26.0 - 34.0 pg   MCHC 30.5 30.0 - 36.0 g/dL   RDW 79.6 (H) 88.4 - 84.4 %   Platelet Count 287 150 - 400 K/uL   nRBC 0.0 0.0 - 0.2 %   Neutrophils Relative % 48 %   Neutro Abs 3.2 1.7 - 7.7 K/uL   Lymphocytes Relative 33 %   Lymphs Abs 2.2 0.7 - 4.0 K/uL   Monocytes Relative 7 %   Monocytes Absolute 0.5 0.1 - 1.0 K/uL   Eosinophils Relative 11 %   Eosinophils Absolute 0.7 (H) 0.0 - 0.5 K/uL   Basophils Relative 1 %   Basophils Absolute 0.1 0.0 - 0.1 K/uL   Immature Granulocytes 0 %   Abs Immature  Granulocytes 0.02 0.00 - 0.07 K/uL  CMP (Cancer Center only)  Result Value Ref Range   Sodium 140 135 - 145 mmol/L   Potassium 4.3 3.5 - 5.1 mmol/L   Chloride 108 98 - 111 mmol/L   CO2 26 22 - 32 mmol/L   Glucose, Bld 100 (H) 70 - 99 mg/dL   BUN 16 8 - 23 mg/dL   Creatinine 8.85 9.38 - 1.24 mg/dL   Calcium  9.2 8.9 - 10.3 mg/dL   Total Protein 6.9 6.5 - 8.1 g/dL   Albumin 4.3 3.5 - 5.0 g/dL   AST 20 15 - 41 U/L   ALT 17 0 - 44 U/L   Alkaline Phosphatase 56 38 - 126 U/L   Total Bilirubin 0.3 0.0 - 1.2 mg/dL   GFR, Estimated >39 >39 mL/min   Anion gap 6 5 - 15     RADIOGRAPHIC STUDIES:  I have personally reviewed the radiological images as listed and agree with the findings in the report.  US  Venous Img Lower  Left (DVT Study) Result Date: 03/27/2023 CLINICAL DATA:  Left knee redness and warmth. EXAM: LEFT LOWER EXTREMITY VENOUS DOPPLER ULTRASOUND TECHNIQUE: Gray-scale sonography with compression, as well as color and duplex ultrasound, were performed to evaluate the deep venous system(s) from the level of the common femoral  vein through the popliteal and proximal calf veins. COMPARISON:  None Available. FINDINGS: VENOUS Normal compressibility of the common femoral, superficial femoral, and popliteal veins, as well as the visualized calf veins. Visualized portions of profunda femoral vein and great saphenous vein unremarkable. No filling defects to suggest DVT on grayscale or color Doppler imaging. Doppler waveforms show normal direction of venous flow, normal respiratory plasticity and response to augmentation. Limited views of the contralateral common femoral vein are unremarkable. OTHER None. Limitations: none IMPRESSION: Negative. Electronically Signed   By: Suzen Dials M.D.   On: 03/27/2023 18:25   DG Knee Complete 4 Views Left Result Date: 03/27/2023 CLINICAL DATA:  Pain and swelling EXAM: LEFT KNEE - COMPLETE 4+ VIEW COMPARISON:  None Available. FINDINGS: No evidence of  fracture, dislocation, or joint effusion. There is mild degenerative narrowing of the medial compartment. There is soft tissue swelling of the anterior knee. Peripheral vascular calcifications are present. Negative. IMPRESSION: 1. No acute fracture or dislocation. 2. Soft tissue swelling of the anterior knee. Electronically Signed   By: Greig Pique M.D.   On: 03/27/2023 17:45    Orders Placed This Encounter  Procedures   CMP (Cancer Center only)    Standing Status:   Future    Number of Occurrences:   1    Expiration Date:   03/27/2024   CBC with Differential (Cancer Center Only)    Standing Status:   Future    Number of Occurrences:   1    Expiration Date:   03/27/2024   Iron and Iron Binding Capacity ([facility] only)    Standing Status:   Future    Number of Occurrences:   1    Expiration Date:   03/27/2024   Ferritin    Standing Status:   Future    Number of Occurrences:   1    Expiration Date:   03/27/2024   Folate    Standing Status:   Future    Number of Occurrences:   1    Expiration Date:   03/27/2024   Vitamin D  25 hydroxy    Standing Status:   Future    Number of Occurrences:   1    Expiration Date:   03/27/2024   CBC with Differential (Cancer Center Only)    Standing Status:   Future    Expected Date:   06/27/2023    Expiration Date:   03/27/2024   Iron and Iron Binding Capacity ([facility] only)    Standing Status:   Future    Expected Date:   06/27/2023    Expiration Date:   03/27/2024   Ferritin    Standing Status:   Future    Expected Date:   06/27/2023    Expiration Date:   03/27/2024    Future Appointments  Date Time Provider Department Center  04/07/2023  8:30 AM Mollie Nestor HERO, PA-C [facility]  05/13/2023  4:00 PM Nahser, Aleene PARAS, MD [facility]  06/27/2023  2:30 PM [facility] None  06/27/2023  3:00 PM Therron Sells, MD [facility] None     I spent a total of 55 minutes during this encounter with the patient including review of  chart and various tests results, discussions about plan of care and coordination of care plan.  This document was completed utilizing speech recognition software. Grammatical errors, random word insertions, pronoun errors, and incomplete sentences are an occasional consequence of this system due to software limitations, ambient noise, and hardware issues.  Any formal questions or concerns about the content, text or information contained within the body of this dictation should be directly addressed to the provider for clarification.

## 2023-03-28 NOTE — Assessment & Plan Note (Signed)
 Osteoarthritis with back pain managed with Celebrex . Discussed benefits of Celebrex  as a selective COX-2 inhibitor with fewer gastrointestinal side effects compared to other NSAIDs. Can be used as needed for pain management.

## 2023-03-28 NOTE — Assessment & Plan Note (Addendum)
 Iron deficiency anemia likely secondary to chronic blood loss, possibly exacerbated by ibuprofen use post-foot surgery.   Hemoglobin improved from 7.6 in December to 11.2 recently.   Ferritin and iron saturation levels remain low. Patient tolerating oral iron well without side effects. Discussed potential need for IV iron infusion if oral iron is insufficient.   IV iron can stabilize levels for six months but may require repeat courses if ongoing blood loss occurs.   Risks of IV iron include infusion reactions, though less common with newer formulations.  Labs today showed hemoglobin of 11.2, MCV 80.5.  White count and platelet count are within normal limits.  Iron studies continue to show evidence of iron deficiency with iron saturation of 4%, iron decreased at 18.  Ferritin pending.  CMP unremarkable.  Will discuss with patient to see if he would like to proceed with IV iron and we will submit request for authorization accordingly.  Potential gastrointestinal blood loss contributing to iron deficiency anemia. History of ibuprofen use, which can cause gastrointestinal bleeding. Last colonoscopy was 15 years ago, with a Cologuard test done a couple of years ago. Referral to GI for further evaluation. Discussed possibility of polyps or iron absorption issues as causes. Colonoscopy and possibly upper endoscopy recommended to identify sources of blood loss.  He currently has an appointment coming up with GI on 04/07/2023.  - Follow-up in three months for re-evaluation with repeat labs including iron studies.

## 2023-03-31 ENCOUNTER — Other Ambulatory Visit: Payer: Self-pay | Admitting: Oncology

## 2023-03-31 LAB — FERRITIN: Ferritin: 17 ng/mL — ABNORMAL LOW (ref 24–336)

## 2023-04-01 ENCOUNTER — Telehealth: Payer: Self-pay | Admitting: Pharmacy Technician

## 2023-04-01 NOTE — Telephone Encounter (Signed)
Dr. Arlana Pouch,  Patient will be scheduled as soon as possible.  Auth Submission: NO AUTH NEEDED Site of care: Site of care: [facility] Payer: [employer] Medication & CPT/J Code(s) submitted: Feraheme (ferumoxytol) [id number] Route of submission (phone, fax, portal):  Phone # Fax # Auth type: Buy/Bill PB Units/visits requested: 2 doses Reference number: [id number] Approval from: 04/01/23 to 08/29/23

## 2023-04-04 ENCOUNTER — Ambulatory Visit: Payer: PPO

## 2023-04-04 VITALS — BP 127/80 | HR 46 | Temp 97.6°F | Resp 18 | Ht 73.0 in | Wt 229.4 lb

## 2023-04-04 DIAGNOSIS — D509 Iron deficiency anemia, unspecified: Secondary | ICD-10-CM

## 2023-04-04 DIAGNOSIS — D500 Iron deficiency anemia secondary to blood loss (chronic): Secondary | ICD-10-CM

## 2023-04-04 MED ORDER — SODIUM CHLORIDE 0.9 % IV SOLN
510.0000 mg | Freq: Once | INTRAVENOUS | Status: AC
  Administered 2023-04-04: 510 mg via INTRAVENOUS
  Filled 2023-04-04: qty 17

## 2023-04-04 MED ORDER — ACETAMINOPHEN 325 MG PO TABS
650.0000 mg | ORAL_TABLET | Freq: Once | ORAL | Status: AC
  Administered 2023-04-04: 650 mg via ORAL
  Filled 2023-04-04: qty 2

## 2023-04-04 MED ORDER — DIPHENHYDRAMINE HCL 25 MG PO CAPS
25.0000 mg | ORAL_CAPSULE | Freq: Once | ORAL | Status: AC
Start: 2023-04-04 — End: 2023-04-04
  Administered 2023-04-04: 25 mg via ORAL
  Filled 2023-04-04: qty 1

## 2023-04-04 NOTE — Patient Instructions (Addendum)

## 2023-04-04 NOTE — Progress Notes (Signed)
Diagnosis: Iron Deficiency Anemia  Provider:  Chilton Greathouse MD  Procedure: IV Infusion  IV Type: Peripheral, IV Location: R Antecubital  Feraheme (Ferumoxytol), Dose: 510 mg  Infusion Start Time: 1624  Infusion Stop Time: 1642  Post Infusion IV Care: Observation period completed and Peripheral IV Discontinued  Discharge: Condition: Good, Destination: Home . AVS Provided  Performed by:  Adriana Mccallum, RN

## 2023-04-07 ENCOUNTER — Other Ambulatory Visit (INDEPENDENT_AMBULATORY_CARE_PROVIDER_SITE_OTHER): Payer: PPO

## 2023-04-07 ENCOUNTER — Encounter: Payer: Self-pay | Admitting: Gastroenterology

## 2023-04-07 ENCOUNTER — Ambulatory Visit: Payer: PPO | Admitting: Gastroenterology

## 2023-04-07 VITALS — BP 124/80 | HR 50 | Ht 73.0 in | Wt 230.0 lb

## 2023-04-07 DIAGNOSIS — Z791 Long term (current) use of non-steroidal anti-inflammatories (NSAID): Secondary | ICD-10-CM | POA: Diagnosis not present

## 2023-04-07 DIAGNOSIS — D509 Iron deficiency anemia, unspecified: Secondary | ICD-10-CM

## 2023-04-07 DIAGNOSIS — K219 Gastro-esophageal reflux disease without esophagitis: Secondary | ICD-10-CM

## 2023-04-07 DIAGNOSIS — D500 Iron deficiency anemia secondary to blood loss (chronic): Secondary | ICD-10-CM | POA: Diagnosis not present

## 2023-04-07 DIAGNOSIS — K2101 Gastro-esophageal reflux disease with esophagitis, with bleeding: Secondary | ICD-10-CM

## 2023-04-07 LAB — COMPREHENSIVE METABOLIC PANEL
ALT: 20 U/L (ref 0–53)
AST: 32 U/L (ref 0–37)
Albumin: 4.4 g/dL (ref 3.5–5.2)
Alkaline Phosphatase: 55 U/L (ref 39–117)
BUN: 14 mg/dL (ref 6–23)
CO2: 27 meq/L (ref 19–32)
Calcium: 9.3 mg/dL (ref 8.4–10.5)
Chloride: 105 meq/L (ref 96–112)
Creatinine, Ser: 1.05 mg/dL (ref 0.40–1.50)
GFR: 72.31 mL/min (ref >60.00)
Glucose, Bld: 91 mg/dL (ref 70–99)
Potassium: 4.5 meq/L (ref 3.5–5.1)
Sodium: 140 meq/L (ref 135–145)
Total Bilirubin: 0.4 mg/dL (ref 0.2–1.2)
Total Protein: 7.1 g/dL (ref 6.0–8.3)

## 2023-04-07 LAB — CBC WITH DIFFERENTIAL/PLATELET
Basophils Absolute: 0.1 10*3/uL (ref 0.0–0.1)
Basophils Relative: 0.9 % (ref 0.0–3.0)
Eosinophils Absolute: 0.6 10*3/uL (ref 0.0–0.7)
Eosinophils Relative: 9.8 % — ABNORMAL HIGH (ref 0.0–5.0)
HCT: 38.8 % — ABNORMAL LOW (ref 39.0–52.0)
Hemoglobin: 12.5 g/dL — ABNORMAL LOW (ref 13.0–17.0)
Lymphocytes Relative: 27.1 % (ref 12.0–46.0)
Lymphs Abs: 1.5 10*3/uL (ref 0.7–4.0)
MCHC: 32.2 g/dL (ref 30.0–36.0)
MCV: 79.1 fL (ref 78.0–100.0)
Monocytes Absolute: 0.5 10*3/uL (ref 0.1–1.0)
Monocytes Relative: 7.9 % (ref 3.0–12.0)
Neutro Abs: 3.1 10*3/uL (ref 1.4–7.7)
Neutrophils Relative %: 54.3 % (ref 43.0–77.0)
Platelets: 264 10*3/uL (ref 150.0–400.0)
RBC: 4.9 Mil/uL (ref 4.22–5.81)
RDW: 21.3 % — ABNORMAL HIGH (ref 11.5–15.5)
WBC: 5.7 10*3/uL (ref 4.0–10.5)

## 2023-04-07 LAB — IBC + FERRITIN
Ferritin: 338.5 ng/mL — ABNORMAL HIGH (ref 22.0–322.0)
Iron: 471 ug/dL — ABNORMAL HIGH (ref 42–165)
Saturation Ratios: 130.9 % — ABNORMAL HIGH (ref 20.0–50.0)
TIBC: 359.8 ug/dL (ref 250.0–450.0)
Transferrin: 257 mg/dL (ref 212.0–360.0)

## 2023-04-07 MED ORDER — SUFLAVE 178.7 G PO SOLR: 1.0000 | Freq: Once | ORAL | 0 refills | Status: AC

## 2023-04-07 MED ORDER — ESOMEPRAZOLE MAGNESIUM 40 MG PO CPDR: 40.0000 mg | DELAYED_RELEASE_CAPSULE | Freq: Two times a day (BID) | ORAL | 1 refills | Status: AC

## 2023-04-07 NOTE — Progress Notes (Addendum)
 Chief Complaint: IDA Primary GI MD: Gentry Fitz  HPI: 68 year old male history of dyslipidemia, hypertension, GERD, hiatal hernia, presents for evaluation of iron deficiency anemia  Seen in ED 02/15/2023 for symptomatic anemia characterized by shortness of breath and fatigue  Labs 02/15/2023 Iron 19, TIBC 542, saturation 4% Ferritin 4 BUN 24 Hgb 7.6 (down from 14.6 1-year ago)  02/24/2023: Hgb 8.6, MCV 77.3, RDW 21.6 03/12/2023: Iron 27, saturation 5%, TIBC 466.  If ferritin 15.4 03/13/2023: Hgb 9.9, MCV 80 03/27/2023: Hgb 11.2 03/28/2023: Hgb 11.2, iron 18, saturation 4%, ferritin 17  Last colonoscopy in 2015 with [facility], thinks he was told he had polyps but unsure what recall they gave him. Cologuard test done a couple years ago.  Patient states he had foot surgery in October and soon after foot surgery he was "popping ibuprofen like candy."  He did this for many weeks until the pain was better.  When the pain got better he began trying to become active and noticed to his fatigue and shortness of breath which prompted his emergency department visit in which they found his iron deficiency anemia.  He has responded well to iron infusions with his most recent hemoglobin 11.2.  He denies melena/hematochezia.  Nausea/vomiting.  Change in bowel habits.  He does have a longstanding history of GERD and has been told he has a hiatal hernia.  His GERD is well-controlled on Nexium 40 Mg once daily.  Past Medical History:  Diagnosis Date   Anemia    GERD (gastroesophageal reflux disease)    Hypercholesteremia    Hypertension    RBBB     Past Surgical History:  Procedure Laterality Date   BUBBLE STUDY  12/21/2021   Procedure: BUBBLE STUDY;  Surgeon: Christell Constant, MD;  Location: [facility] END[facility];  Service: Cardiovascular;;   FOOT SURGERY Right 10/20/2022   HERNIA REPAIR     TEE WITHOUT CARDIOVERSION N/A 12/21/2021   Procedure: TRANSESOPHAGEAL ECHOCARDIOGRAM (TEE);   Surgeon: Christell Constant, MD;  Location: [facility] ENDOSCOPY;  Service: Cardiovascular;  Laterality: N/A;   UVULECTOMY      Current Outpatient Medications  Medication Sig Dispense Refill   celecoxib (CELEBREX) 100 MG capsule TAKE 1 CAPSULE(100 MG) BY MOUTH DAILY (Patient taking differently: Take 100 mg by mouth as needed.) 90 capsule 0   esomeprazole (NEXIUM) 40 MG capsule Take 1 capsule (40 mg total) by mouth daily. 90 capsule 1   ezetimibe (ZETIA) 10 MG tablet Take 1 tablet (10 mg total) by mouth daily. 90 tablet 1   ferrous sulfate 325 (65 FE) MG tablet Take 1 tablet (325 mg total) by mouth daily. 30 tablet 0   Multiple Vitamin (MULTIVITAMIN) capsule Take 1 capsule by mouth daily.     rosuvastatin (CRESTOR) 20 MG tablet Take 1 tablet (20 mg total) by mouth daily. 90 tablet 2   SIMBRINZA 1-0.2 % SUSP Place 1 drop into the left eye 3 (three) times daily. Glaucoma     brimonidine (ALPHAGAN) 0.2 % ophthalmic solution Place 1 drop into the left eye 2 (two) times daily. (Patient not taking: Reported on 04/07/2023)     No current facility-administered medications for this visit.    Allergies as of 04/07/2023 - Review Complete 04/07/2023  Allergen Reaction Noted   Penicillins Other (See Comments) 06/17/2014   Irbesartan Other (See Comments) 03/24/2019    Family History  Problem Relation Age of Onset   Hypertension Mother     Social History   Socioeconomic History  Marital status: Married    Spouse name: Not on file   Number of children: Not on file   Years of education: Not on file   Highest education level: Master's degree (e.g., MA, MS, MEng, MEd, MSW, MBA)  Occupational History   Not on file  Tobacco Use   Smoking status: Some Days    Types: Cigars    Passive exposure: Never   Smokeless tobacco: Never  Vaping Use   Vaping status: Never Used  Substance and Sexual Activity   Alcohol use: Not Currently    Alcohol/week: 0.0 standard drinks of alcohol   Drug use: Yes     Types: Marijuana   Sexual activity: Yes    Partners: Female  Other Topics Concern   Not on file  Social History Narrative   Not on file   Social Drivers of Health   Financial Resource Strain: Low Risk  (03/08/2023)   Overall Financial Resource Strain (CARDIA)    Difficulty of Paying Living Expenses: Not hard at all  Food Insecurity: No Food Insecurity (03/28/2023)   Hunger Vital Sign    Worried About Running Out of Food in the Last Year: Never true    Ran Out of Food in the Last Year: Never true  Transportation Needs: No Transportation Needs (03/28/2023)   PRAPARE - Administrator, Civil Service (Medical): No    Lack of Transportation (Non-Medical): No  Physical Activity: Insufficiently Active (03/08/2023)   Exercise Vital Sign    Days of Exercise per Week: 4 days    Minutes of Exercise per Session: 30 min  Stress: Stress Concern Present (03/08/2023)   [employer] of Occupational Health - Occupational Stress Questionnaire    Feeling of Stress : To some extent  Social Connections: Socially Integrated (03/08/2023)   Social Connection and Isolation Panel [NHANES]    Frequency of Communication with Friends and Family: More than three times a week    Frequency of Social Gatherings with Friends and Family: Twice a week    Attends Religious Services: More than 4 times per year    Active Member of [employer] or Organizations: Yes    Attends Engineer, structural: More than 4 times per year    Marital Status: Married  Catering manager Violence: Not At Risk (03/28/2023)   Humiliation, Afraid, Rape, and Kick questionnaire    Fear of Current or Ex-Partner: No    Emotionally Abused: No    Physically Abused: No    Sexually Abused: No    Review of Systems:    Constitutional: No weight loss, fever, chills, weakness or fatigue HEENT: Eyes: No change in vision               Ears, Nose, Throat:  No change in hearing or congestion Skin: No rash or itching Cardiovascular: No  chest pain, chest pressure or palpitations   Respiratory: No SOB or cough Gastrointestinal: See HPI and otherwise negative Genitourinary: No dysuria or change in urinary frequency Neurological: No headache, dizziness or syncope Musculoskeletal: No new muscle or joint pain Hematologic: No bleeding or bruising Psychiatric: No history of depression or anxiety    Physical Exam:  Vital signs: BP 124/80   Pulse (!) 50   Ht 6\' 1"  (1.854 m)   Wt 230 lb (104.3 kg)   BMI 30.34 kg/m   Constitutional: NAD, Well developed, Well nourished, alert and cooperative Head:  Normocephalic and atraumatic. Eyes:   PEERL, EOMI. No icterus. Conjunctiva pink. Respiratory: Respirations  even and unlabored. Lungs clear to auscultation bilaterally.   No wheezes, crackles, or rhonchi.  Cardiovascular:  Regular rate and rhythm. No peripheral edema, cyanosis or pallor.  Gastrointestinal:  Soft, nondistended, nontender. No rebound or guarding. Normal bowel sounds. No appreciable masses or hepatomegaly. Rectal:  Not performed.  Msk:  Symmetrical without gross deformities. Without edema, no deformity or joint abnormality.  Neurologic:  Alert and  oriented x4;  grossly normal neurologically.  Skin:   Dry and intact without significant lesions or rashes. Psychiatric: Oriented to person, place and time. Demonstrates good judgement and reason without abnormal affect or behaviors.    RELEVANT LABS AND IMAGING: CBC    Component Value Date/Time   WBC 6.6 03/28/2023 1511   WBC 7.6 03/27/2023 1721   RBC 4.56 03/28/2023 1511   HGB 11.2 (L) 03/28/2023 1511   HGB 14.6 12/14/2021 0949   HCT 36.7 (L) 03/28/2023 1511   HCT 43.4 12/14/2021 0949   PLT 287 03/28/2023 1511   PLT 269 12/14/2021 0949   MCV 80.5 03/28/2023 1511   MCV 92 12/14/2021 0949   MCH 24.6 (L) 03/28/2023 1511   MCHC 30.5 03/28/2023 1511   RDW 20.3 (H) 03/28/2023 1511   RDW 12.6 12/14/2021 0949   LYMPHSABS 2.2 03/28/2023 1511   MONOABS 0.5  03/28/2023 1511   EOSABS 0.7 (H) 03/28/2023 1511   BASOSABS 0.1 03/28/2023 1511    CMP     Component Value Date/Time   NA 140 03/28/2023 1511   NA 139 12/14/2021 0949   K 4.3 03/28/2023 1511   CL 108 03/28/2023 1511   CO2 26 03/28/2023 1511   GLUCOSE 100 (H) 03/28/2023 1511   BUN 16 03/28/2023 1511   BUN 17 12/14/2021 0949   CREATININE 1.14 03/28/2023 1511   CALCIUM 9.2 03/28/2023 1511   PROT 6.9 03/28/2023 1511   ALBUMIN 4.3 03/28/2023 1511   AST 20 03/28/2023 1511   ALT 17 03/28/2023 1511   ALKPHOS 56 03/28/2023 1511   BILITOT 0.3 03/28/2023 1511   GFRNONAA >60 03/28/2023 1511     Assessment/Plan:   Iron deficiency anemia in the setting of NSAID use New iron deficiency anemia after excessive NSAID use post foot surgery.  No overt bleeding.  Responding well to iron infusions with stabilized hemoglobin of 11.2 and improvement in symptoms.  Last colonoscopy 2015.  DDx includes esophagitis, gastritis, PUD - Schedule EGD/colonoscopy - I thoroughly discussed the procedure with the patient (at bedside) to include nature of the procedure, alternatives, benefits, and risks (including but not limited to bleeding, infection, perforation, anesthesia/cardiac pulmonary complications).  Patient verbalized understanding and gave verbal consent to proceed with procedure. - CBC, CMP, iron studies - Increase Nexium to twice daily  GERD Well-controlled on Nexium 40 Mg once daily -Increase Nexium to twice daily in the setting of iron deficiency anemia and concern for PUD  [facility] 04/07/2023, 8:56 AM  Cc: Etta Grandchild, MD  I have reviewed the clinic note as outlined by Boone Master, PA and agree with the assessment, plan and medical decision making.  Mr. Warmuth is referred for evaluation of symptomatic iron deficiency anemia.  Hemoglobin declined to 8.6 after taking NSAIDs following foot surgery.  No melena or hematochezia.  Improved with iron  therapy.  Last colonoscopy in 2015.  May have had polyps.  No previous EGD.  Does have a history of GERD that is currently well-controlled.  Agree with proceeding with EGD and colonoscopy for further evaluation  of iron deficiency anemia and polyp surveillance.   Maren Beach, MD

## 2023-04-07 NOTE — Patient Instructions (Signed)
 Your provider has requested that you go to the basement level for lab work before leaving today. Press "B" on the elevator. The lab is located at the first door on the left as you exit the elevator.   We have sent the following medications to your pharmacy for you to pick up at your convenience: Increase Nexium to twice daily.  You have been scheduled for an endoscopy and colonoscopy. Please follow the written instructions given to you at your visit today.  If you use inhalers (even only as needed), please bring them with you on the day of your procedure.  DO NOT TAKE 7 DAYS PRIOR TO TEST- Trulicity (dulaglutide) Ozempic, Wegovy (semaglutide) Mounjaro (tirzepatide) Bydureon Bcise (exanatide extended release)  DO NOT TAKE 1 DAY PRIOR TO YOUR TEST Rybelsus (semaglutide) Adlyxin (lixisenatide) Victoza (liraglutide) Byetta (exanatide) ___________________________________________________________________________  Bonita Quin will receive your bowel preparation through [facility], which ensures the lowest copay and home delivery, with outreach via text or call from an [phone] number. Please respond promptly to avoid rescheduling of your procedure. If you are interested in alternative options or have any questions regarding your prep, please contact them at [phone] ____________________________________________________________________________  Your Provider Has Sent Your Bowel Prep Regimen To [facility]   [facility] will contact you to verify your information and collect your copay, if applicable. Enjoy the comfort of your home while your prescription is mailed to you, FREE of any shipping charges.   [facility] accepts all major insurance benefits and applies discounts & coupons.  Have additional questions?   Chat: [email or web address] Call: [phone] Email: [email or web address] NCPDP: [id number]  How will [facility] contact you?  With a Welcome phone call,  a Welcome text and a  checkout link in text form.  Texts you receive from [phone] Are NOT Spam.  *To set up delivery, you must complete the checkout process via link or speak to one of the patient care representatives. If [facility] is unable to reach you, your prescription may be delayed.  To avoid long hold times on the phone, you may also utilize the secure chat feature on the [facility] website to request that they call you back for transaction completion or to expedite your concerns.

## 2023-04-11 ENCOUNTER — Ambulatory Visit: Payer: PPO

## 2023-04-11 VITALS — BP 150/88 | HR 46 | Temp 97.6°F | Resp 16 | Ht 73.0 in | Wt 231.6 lb

## 2023-04-11 DIAGNOSIS — D500 Iron deficiency anemia secondary to blood loss (chronic): Secondary | ICD-10-CM

## 2023-04-11 DIAGNOSIS — D509 Iron deficiency anemia, unspecified: Secondary | ICD-10-CM | POA: Diagnosis not present

## 2023-04-11 MED ORDER — SODIUM CHLORIDE 0.9 % IV SOLN
510.0000 mg | Freq: Once | INTRAVENOUS | Status: AC
  Administered 2023-04-11: 510 mg via INTRAVENOUS
  Filled 2023-04-11: qty 17

## 2023-04-11 MED ORDER — DIPHENHYDRAMINE HCL 25 MG PO CAPS: 25.0000 mg | ORAL_CAPSULE | Freq: Once | ORAL | Status: DC

## 2023-04-11 MED ORDER — ACETAMINOPHEN 325 MG PO TABS
650.0000 mg | ORAL_TABLET | Freq: Once | ORAL | Status: DC
Start: 2023-04-11 — End: 2023-04-11

## 2023-04-11 NOTE — Progress Notes (Signed)
 Diagnosis: Iron Deficiency Anemia  Provider:  Chilton Greathouse MD  Procedure: IV Infusion  IV Type: Peripheral, IV Location: R Hand  Feraheme (Ferumoxytol), Dose: 510 mg  Infusion Start Time: 1538  pm  Infusion Stop Time: 1554  pm  Post Infusion IV Care: Observation period completed and Peripheral IV Discontinued  Discharge: Condition: Good, Destination: Home . AVS Declined  Performed by:  Adriana Mccallum, RN

## 2023-04-11 NOTE — Addendum Note (Signed)
 Addended by: Nat Math on: 04/11/2023 04:30 PM   Modules accepted: Orders

## 2023-04-16 DIAGNOSIS — M2041 Other hammer toe(s) (acquired), right foot: Secondary | ICD-10-CM | POA: Diagnosis not present

## 2023-04-16 DIAGNOSIS — Q66229 Congenital metatarsus adductus, unspecified foot: Secondary | ICD-10-CM | POA: Diagnosis not present

## 2023-04-16 DIAGNOSIS — M2011 Hallux valgus (acquired), right foot: Secondary | ICD-10-CM | POA: Diagnosis not present

## 2023-04-17 DIAGNOSIS — H401222 Low-tension glaucoma, left eye, moderate stage: Secondary | ICD-10-CM | POA: Diagnosis not present

## 2023-04-17 DIAGNOSIS — H25813 Combined forms of age-related cataract, bilateral: Secondary | ICD-10-CM | POA: Diagnosis not present

## 2023-04-23 ENCOUNTER — Encounter: Payer: Self-pay | Admitting: Pediatrics

## 2023-04-29 NOTE — Progress Notes (Unsigned)
 [facility] History and Physical   Primary Care Physician:  Etta Grandchild, MD   Reason for Procedure:  Iron deficiency anemia, possible history of colon polyps  Plan:    Upper endoscopy and colonoscopy  HPI: Jerry Martin is a 68 y.o. male undergoing upper endoscopy and colonoscopy for evaluation and management of iron deficiency anemia.  Previously had a hemoglobin of 8.6 in January 2025 after taking frequent NSAIDs.  Subsequently started on iron infusions with improvement in hemoglobin to 11.2.  Has a history of GERD but no overt GI bleeding-no melena, hematochezia, hematemesis.  Reports a possible colonoscopy in 2015 at [facility] that may have had polyps.  Negative Cologuard a couple of years ago.  No previous EGD.  No family history of colorectal cancer or polyps.   Past Medical History:  Diagnosis Date   Anemia    GERD (gastroesophageal reflux disease)    Hypercholesteremia    Hypertension    RBBB     Past Surgical History:  Procedure Laterality Date   BUBBLE STUDY  12/21/2021   Procedure: BUBBLE STUDY;  Surgeon: Christell Constant, MD;  Location: [facility];  Service: Cardiovascular;;   FOOT SURGERY Right 10/20/2022   HERNIA REPAIR     TEE WITHOUT CARDIOVERSION N/A 12/21/2021   Procedure: TRANSESOPHAGEAL ECHOCARDIOGRAM (TEE);  Surgeon: Christell Constant, MD;  Location: [facility]COPY;  Service: Cardiovascular;  Laterality: N/A;   UVULECTOMY      Prior to Admission medications   Medication Sig Start Date End Date Taking? Authorizing Provider  brimonidine (ALPHAGAN) 0.2 % ophthalmic solution Place 1 drop into the left eye 2 (two) times daily. Patient not taking: Reported on 04/07/2023 04/10/22   [provider]  celecoxib (CELEBREX) 100 MG capsule TAKE 1 CAPSULE(100 MG) BY MOUTH DAILY Patient taking differently: Take 100 mg by mouth as needed. 08/17/22   Etta Grandchild, MD  esomeprazole (NEXIUM) 40 MG capsule Take 1 capsule (40 mg total) by  mouth 2 (two) times daily before a meal. 04/07/23   McMichael, Bayley M, PA-C  ezetimibe (ZETIA) 10 MG tablet Take 1 tablet (10 mg total) by mouth daily. 02/24/23   Moshe Cipro, FNP  ferrous sulfate 325 (65 FE) MG tablet Take 1 tablet (325 mg total) by mouth daily. 02/15/23   Rexford Maus, DO  Multiple Vitamin (MULTIVITAMIN) capsule Take 1 capsule by mouth daily.    [provider]  rosuvastatin (CRESTOR) 20 MG tablet Take 1 tablet (20 mg total) by mouth daily. 02/24/23   Moshe Cipro, FNP  SIMBRINZA 1-0.2 % SUSP Place 1 drop into the left eye 3 (three) times daily. Glaucoma    [provider]    Current Outpatient Medications  Medication Sig Dispense Refill   brimonidine (ALPHAGAN) 0.2 % ophthalmic solution Place 1 drop into the left eye 2 (two) times daily. (Patient not taking: Reported on 04/07/2023)     celecoxib (CELEBREX) 100 MG capsule TAKE 1 CAPSULE(100 MG) BY MOUTH DAILY (Patient taking differently: Take 100 mg by mouth as needed.) 90 capsule 0   esomeprazole (NEXIUM) 40 MG capsule Take 1 capsule (40 mg total) by mouth 2 (two) times daily before a meal. 180 capsule 1   ezetimibe (ZETIA) 10 MG tablet Take 1 tablet (10 mg total) by mouth daily. 90 tablet 1   ferrous sulfate 325 (65 FE) MG tablet Take 1 tablet (325 mg total) by mouth daily. 30 tablet 0   Multiple Vitamin (MULTIVITAMIN) capsule Take 1 capsule by  mouth daily.     rosuvastatin (CRESTOR) 20 MG tablet Take 1 tablet (20 mg total) by mouth daily. 90 tablet 2   SIMBRINZA 1-0.2 % SUSP Place 1 drop into the left eye 3 (three) times daily. Glaucoma     No current facility-administered medications for this visit.    Allergies as of 04/30/2023 - Review Complete 04/07/2023  Allergen Reaction Noted   Penicillins Other (See Comments) 06/17/2014   Irbesartan Other (See Comments) 03/24/2019    Family History  Problem Relation Age of Onset   Hypertension Mother     Social History    Socioeconomic History   Marital status: Married    Spouse name: Not on file   Number of children: Not on file   Years of education: Not on file   Highest education level: Master's degree (e.g., MA, MS, MEng, MEd, MSW, MBA)  Occupational History   Not on file  Tobacco Use   Smoking status: Some Days    Types: Cigars    Passive exposure: Never   Smokeless tobacco: Never  Vaping Use   Vaping status: Never Used  Substance and Sexual Activity   Alcohol use: Not Currently    Alcohol/week: 0.0 standard drinks of alcohol   Drug use: Yes    Types: Marijuana   Sexual activity: Yes    Partners: Female  Other Topics Concern   Not on file  Social History Narrative   Not on file   Social Drivers of Health   Financial Resource Strain: Low Risk  (03/08/2023)   Overall Financial Resource Strain (CARDIA)    Difficulty of Paying Living Expenses: Not hard at all  Food Insecurity: No Food Insecurity (03/28/2023)   Hunger Vital Sign    Worried About Running Out of Food in the Last Year: Never true    Ran Out of Food in the Last Year: Never true  Transportation Needs: No Transportation Needs (03/28/2023)   PRAPARE - Administrator, Civil Service (Medical): No    Lack of Transportation (Non-Medical): No  Physical Activity: Insufficiently Active (03/08/2023)   Exercise Vital Sign    Days of Exercise per Week: 4 days    Minutes of Exercise per Session: 30 min  Stress: Stress Concern Present (03/08/2023)   [employer] of Occupational Health - Occupational Stress Questionnaire    Feeling of Stress : To some extent  Social Connections: Socially Integrated (03/08/2023)   Social Connection and Isolation Panel [NHANES]    Frequency of Communication with Friends and Family: More than three times a week    Frequency of Social Gatherings with Friends and Family: Twice a week    Attends Religious Services: More than 4 times per year    Active Member of [employer] or Organizations: Yes     Attends Engineer, structural: More than 4 times per year    Marital Status: Married  Catering manager Violence: Not At Risk (03/28/2023)   Humiliation, Afraid, Rape, and Kick questionnaire    Fear of Current or Ex-Partner: No    Emotionally Abused: No    Physically Abused: No    Sexually Abused: No    Review of Systems:  All other review of systems negative except as mentioned in the HPI.  Physical Exam: Vital signs There were no vitals taken for this visit.  General:   Alert,  Well-developed, well-nourished, pleasant and cooperative in NAD Airway:  Mallampati  Lungs:  Clear throughout to auscultation.   Heart:  Regular  rate and rhythm; no murmurs, clicks, rubs,  or gallops. Abdomen:  Soft, nontender and nondistended. Normal bowel sounds.   Neuro/Psych:  Normal mood and affect. A and O x 3  Maren Beach, MD [facility]

## 2023-04-30 ENCOUNTER — Ambulatory Visit: Payer: PPO | Admitting: Pediatrics

## 2023-04-30 ENCOUNTER — Encounter: Payer: Self-pay | Admitting: Pediatrics

## 2023-04-30 VITALS — BP 137/82 | HR 62 | Temp 97.7°F | Resp 12 | Ht 73.0 in | Wt 230.0 lb

## 2023-04-30 DIAGNOSIS — K621 Rectal polyp: Secondary | ICD-10-CM | POA: Diagnosis not present

## 2023-04-30 DIAGNOSIS — I10 Essential (primary) hypertension: Secondary | ICD-10-CM | POA: Diagnosis not present

## 2023-04-30 DIAGNOSIS — K449 Diaphragmatic hernia without obstruction or gangrene: Secondary | ICD-10-CM

## 2023-04-30 DIAGNOSIS — K2950 Unspecified chronic gastritis without bleeding: Secondary | ICD-10-CM | POA: Diagnosis not present

## 2023-04-30 DIAGNOSIS — K2101 Gastro-esophageal reflux disease with esophagitis, with bleeding: Secondary | ICD-10-CM

## 2023-04-30 DIAGNOSIS — K5730 Diverticulosis of large intestine without perforation or abscess without bleeding: Secondary | ICD-10-CM

## 2023-04-30 DIAGNOSIS — K2951 Unspecified chronic gastritis with bleeding: Secondary | ICD-10-CM | POA: Diagnosis not present

## 2023-04-30 DIAGNOSIS — D509 Iron deficiency anemia, unspecified: Secondary | ICD-10-CM | POA: Diagnosis not present

## 2023-04-30 DIAGNOSIS — D120 Benign neoplasm of cecum: Secondary | ICD-10-CM

## 2023-04-30 DIAGNOSIS — E7800 Pure hypercholesterolemia, unspecified: Secondary | ICD-10-CM | POA: Diagnosis not present

## 2023-04-30 DIAGNOSIS — D500 Iron deficiency anemia secondary to blood loss (chronic): Secondary | ICD-10-CM

## 2023-04-30 DIAGNOSIS — K635 Polyp of colon: Secondary | ICD-10-CM | POA: Diagnosis not present

## 2023-04-30 DIAGNOSIS — D122 Benign neoplasm of ascending colon: Secondary | ICD-10-CM

## 2023-04-30 MED ORDER — SODIUM CHLORIDE 0.9 % IV SOLN: 500.0000 mL | Freq: Once | INTRAVENOUS | Status: DC

## 2023-04-30 NOTE — Op Note (Signed)
 [facility] Patient Name: Faye Sanfilippo Procedure Date: 04/30/2023 3:17 PM MRN: [id number] Endoscopist: Maren Beach , MD, [id number] Age: 68 Referring MD:  Date of Birth: 12-01-1955 Gender: Male Account #: [id number] Procedure:                Colonoscopy Indications:              High risk colon cancer surveillance: Personal                            history of colonic polyps, Last colonoscopy: 2015,                            Incidental - Iron deficiency anemia Medicines:                Monitored Anesthesia Care Procedure:                Pre-Anesthesia Assessment:                           - Prior to the procedure, a History and Physical                            was performed, and patient medications and                            allergies were reviewed. The patient's tolerance of                            previous anesthesia was also reviewed. The risks                            and benefits of the procedure and the sedation                            options and risks were discussed with the patient.                            All questions were answered, and informed consent                            was obtained. Prior Anticoagulants: The patient has                            taken no anticoagulant or antiplatelet agents. ASA                            Grade Assessment: II - A patient with mild systemic                            disease. After reviewing the risks and benefits,                            the patient was deemed in satisfactory condition to  undergo the procedure.                           After obtaining informed consent, the colonoscope                            was passed under direct vision. Throughout the                            procedure, the patient's blood pressure, pulse, and                            oxygen saturations were monitored continuously. The                            CF [id number] #[id number] was  introduced through the anus                            and advanced to the terminal ileum. The colonoscopy                            was performed without difficulty. The patient                            tolerated the procedure well. The quality of the                            bowel preparation was good. The terminal ileum,                            ileocecal valve, appendiceal orifice, and rectum                            were photographed. Scope In: 3:31:03 PM Scope Out: 3:46:17 PM Scope Withdrawal Time: 0 hours 12 minutes 9 seconds  Total Procedure Duration: 0 hours 15 minutes 14 seconds  Findings:                 The perianal and digital rectal examinations were                            normal. Pertinent negatives include normal                            sphincter tone and no palpable rectal lesions.                           A few small-mouthed diverticula were found in the                            sigmoid colon.                           Three sessile polyps were found in the rectum,  ascending colon and cecum. The polyps were 4 to 8                            mm in size. These polyps were removed with a cold                            snare. Resection and retrieval were complete.                           The terminal ileum appeared normal.                           The retroflexed view of the distal rectum and anal                            verge was normal and showed no anal or rectal                            abnormalities. Complications:            No immediate complications. Estimated blood loss:                            Minimal. Estimated Blood Loss:     Estimated blood loss was minimal. Impression:               - Diverticulosis in the sigmoid colon.                           - Three 4 to 8 mm polyps in the rectum, in the                            ascending colon and in the cecum, removed with a                            cold  snare. Resected and retrieved.                           - The examined portion of the ileum was normal. Recommendation:           - Discharge patient to home (ambulatory).                           - Await pathology results.                           - Repeat colonoscopy for surveillance based on                            pathology results.                           - The findings and recommendations were discussed  with the patient's family.                           - Patient has a contact number available for                            emergencies. The signs and symptoms of potential                            delayed complications were discussed with the                            patient. Return to normal activities tomorrow.                            Written discharge instructions were provided to the                            patient. Maren Beach, MD 04/30/2023 3:55:19 PM This report has been signed electronically.

## 2023-04-30 NOTE — Progress Notes (Signed)
 Sedate, gd SR, tolerated procedure well, VSS, report to RN

## 2023-04-30 NOTE — Op Note (Signed)
 [facility] Patient Name: Jerry Martin Procedure Date: 04/30/2023 3:17 PM MRN: [id number] Endoscopist: Maren Beach , MD, [id number] Age: 68 Referring MD:  Date of Birth: 08-16-1955 Gender: Male Account #: [id number] Procedure:                Upper GI endoscopy Indications:              Iron deficiency anemia, Follow-up of                            gastro-esophageal reflux disease Medicines:                Monitored Anesthesia Care Procedure:                Pre-Anesthesia Assessment:                           - Prior to the procedure, a History and Physical                            was performed, and patient medications and                            allergies were reviewed. The patient's tolerance of                            previous anesthesia was also reviewed. The risks                            and benefits of the procedure and the sedation                            options and risks were discussed with the patient.                            All questions were answered, and informed consent                            was obtained. Prior Anticoagulants: The patient has                            taken no anticoagulant or antiplatelet agents. ASA                            Grade Assessment: II - A patient with mild systemic                            disease. After reviewing the risks and benefits,                            the patient was deemed in satisfactory condition to                            undergo the procedure.  After obtaining informed consent, the endoscope was                            passed under direct vision. Throughout the                            procedure, the patient's blood pressure, pulse, and                            oxygen saturations were monitored continuously. The                            GIF [id number] #[id number] was introduced through the                            mouth, and advanced to the second part of  duodenum.                            The upper GI endoscopy was accomplished without                            difficulty. The patient tolerated the procedure                            well. Scope In: Scope Out: Findings:                 The examined esophagus was normal.                           The gastric body, gastric antrum, cardia (on                            retroflexion) and gastric fundus (on retroflexion)                            were normal. Biopsies were taken with a cold                            forceps for Helicobacter pylori testing.                           A large hiatal hernia was present.                           The duodenal bulb and second portion of the                            duodenum were normal. 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 Biopsies for                            histology were taken with a cold forceps  for                            evaluation of celiac disease. Complications:            No immediate complications. Estimated blood loss:                            Minimal. Estimated Blood Loss:     Estimated blood loss was minimal. Impression:               - Normal esophagus.                           - Normal gastric body, antrum, cardia and gastric                            fundus. Biopsied.                           - Large hiatal hernia.                           - Normal duodenal bulb and second portion of the                            duodenum. Brunner's gland hyperplasia was seen in                            the duodenal bulb. Biopsied. Recommendation:           - Await pathology results.                           - Perform a colonoscopy today.                           - The findings and recommendations were discussed                            with the patient's family. Maren Beach, MD 04/30/2023 3:50:01 PM This report has been signed electronically.

## 2023-04-30 NOTE — Progress Notes (Signed)
 Pt's states no medical or surgical changes since previsit or office visit.

## 2023-04-30 NOTE — Progress Notes (Signed)
 Called to room to assist during endoscopic procedure.  Patient ID and intended procedure confirmed with present staff. Received instructions for my participation in the procedure from the performing physician.

## 2023-04-30 NOTE — Patient Instructions (Addendum)
 -Handout on polyps, diverticulosis and hiatal hernia provided -await pathology results -repeat colonoscopy for surveillance recommended. Date to be determined when pathology result become available   -Continue present medications   YOU HAD AN ENDOSCOPIC PROCEDURE TODAY AT THE [facility]:   Refer to the procedure report that was given to you for any specific questions about what was found during the examination.  If the procedure report does not answer your questions, please call your gastroenterologist to clarify.  If you requested that your care partner not be given the details of your procedure findings, then the procedure report has been included in a sealed envelope for you to review at your convenience later.  YOU SHOULD EXPECT: Some feelings of bloating in the abdomen. Passage of more gas than usual.  Walking can help get rid of the air that was put into your GI tract during the procedure and reduce the bloating. If you had a lower endoscopy (such as a colonoscopy or flexible sigmoidoscopy) you may notice spotting of blood in your stool or on the toilet paper. If you underwent a bowel prep for your procedure, you may not have a normal bowel movement for a few days.  Please Note:  You might notice some irritation and congestion in your nose or some drainage.  This is from the oxygen used during your procedure.  There is no need for concern and it should clear up in a day or so.  SYMPTOMS TO REPORT IMMEDIATELY:  Following lower endoscopy (colonoscopy or flexible sigmoidoscopy):  Excessive amounts of blood in the stool  Significant tenderness or worsening of abdominal pains  Swelling of the abdomen that is new, acute  Fever of 100F or higher  Following upper endoscopy (EGD)  Vomiting of blood or coffee ground material  New chest pain or pain under the shoulder blades  Painful or persistently difficult swallowing  New shortness of breath  Fever of 100F or higher  Black,  tarry-looking stools  For urgent or emergent issues, a gastroenterologist can be reached at any hour by calling [phone]. Do not use MyChart messaging for urgent concerns.    DIET:  We do recommend a small meal at first, but then you may proceed to your regular diet.  Drink plenty of fluids but you should avoid alcoholic beverages for 24 hours.  ACTIVITY:  You should plan to take it easy for the rest of today and you should NOT DRIVE or use heavy machinery until tomorrow (because of the sedation medicines used during the test).    FOLLOW UP: Our staff will call the number listed on your records the next business day following your procedure.  We will call around 7:15- 8:00 am to check on you and address any questions or concerns that you may have regarding the information given to you following your procedure. If we do not reach you, we will leave a message.     If any biopsies were taken you will be contacted by phone or by letter within the next 1-3 weeks.  Please call us at [phone] if you have not heard about the biopsies in 3 weeks.    SIGNATURES/CONFIDENTIALITY: You and/or your care partner have signed paperwork which will be entered into your electronic medical record.  These signatures attest to the fact that that the information above on your After Visit Summary has been reviewed and is understood.  Full responsibility of the confidentiality of this discharge information lies with you and/or your  care-partner.  Hiatal Hernia  A hiatal hernia occurs when part of the stomach slides above the muscle that separates the abdomen from the chest (diaphragm). A person can be born with a hiatal hernia (congenital), or it may develop over time. In almost all cases of hiatal hernia, only the top part of the stomach pushes through the diaphragm. Many people have a hiatal hernia with no symptoms. The larger the hernia, the more likely it is that you will have symptoms. In some cases, a  hiatal hernia allows stomach acid to flow back into the tube that carries food from your mouth to your stomach (esophagus). This may cause heartburn symptoms. The development of heartburn symptoms may mean that you have a condition called gastroesophageal reflux disease (GERD). What are the causes? This condition is caused by a weakness in the opening (hiatus) where the esophagus passes through the diaphragm to attach to the upper part of the stomach. A person may be born with a weakness in the hiatus, or a weakness can develop over time. What increases the risk? This condition is more likely to develop in: Older people. Age is a major risk factor for a hiatal hernia, especially if you are over the age of 62. Pregnant women. People who are overweight. People who have frequent constipation. What are the signs or symptoms? Symptoms of this condition usually develop in the form of GERD symptoms. Symptoms include: Heartburn. Upset stomach (indigestion). Trouble swallowing. Coughing or wheezing. Wheezing is making high-pitched whistling sounds when you breathe. Sore throat. Chest pain. Nausea and vomiting. How is this diagnosed? This condition may be diagnosed during testing for GERD. Tests that may be done include: X-rays of your stomach or chest. An upper gastrointestinal (GI) series. This is an X-ray exam of your GI tract that is taken after you swallow a chalky liquid that shows up clearly on the X-ray. Endoscopy. This is a procedure to look into your stomach using a thin, flexible tube that has a tiny camera and light on the end of it. How is this treated? This condition may be treated by: Dietary and lifestyle changes to help reduce GERD symptoms. Medicines. These may include: Over-the-counter antacids. Medicines that make your stomach empty more quickly. Medicines that block the production of stomach acid (H2 blockers). Stronger medicines to reduce stomach acid (proton pump  inhibitors). Surgery to repair the hernia, if other treatments are not helping. If you have no symptoms, you may not need treatment. Follow these instructions at home: Lifestyle and activity Do not use any products that contain nicotine or tobacco. These products include cigarettes, chewing tobacco, and vaping devices, such as e-cigarettes. If you need help quitting, ask your health care provider. Try to achieve and maintain a healthy body weight. Avoid putting pressure on your abdomen. Anything that puts pressure on your abdomen increases the amount of acid that may be pushed up into your esophagus. Avoid bending over, especially after eating. Raise the head of your bed by putting blocks under the legs. This keeps your head and esophagus higher than your stomach. Do not wear tight clothing around your chest or stomach. Try not to strain when having a bowel movement, when urinating, or when lifting heavy objects. Eating and drinking Avoid foods that can worsen GERD symptoms. These may include: Fatty foods, like fried foods. Citrus fruits, like oranges or lemon. Other foods and drinks that contain acid, like orange juice or tomatoes. Spicy food. Chocolate. Eat frequent small meals instead of three  large meals a day. This helps prevent your stomach from getting too full. Eat slowly. Do not lie down right after eating. Do not eat 1-2 hours before bed. Do not drink beverages with caffeine. These include cola, coffee, cocoa, and tea. Do not drink alcohol. General instructions Take over-the-counter and prescription medicines only as told by your health care provider. Keep all follow-up visits. Your health care provider will want to check that any new prescribed medicines are helping your symptoms. Contact a health care provider if: Your symptoms are not controlled with medicines or lifestyle changes. You are having trouble swallowing. You have coughing or wheezing that will not go  away. Your pain is getting worse. Your pain spreads to your arms, neck, jaw, teeth, or back. You feel nauseous or you vomit. Get help right away if: You have shortness of breath. You vomit blood. You have bright red blood in your stools. You have black, tarry stools. These symptoms may be an emergency. Get help right away. Call 911. Do not wait to see if the symptoms will go away. Do not drive yourself to the hospital. Summary A hiatal hernia occurs when part of the stomach slides above the muscle that separates the abdomen from the chest. A person may be born with a weakness in the hiatus, or a weakness can develop over time. Symptoms of a hiatal hernia may include heartburn, trouble swallowing, or sore throat. Management of a hiatal hernia includes eating frequent small meals instead of three large meals a day. Get help right away if you vomit blood, have bright red blood in your stools, or have black, tarry stools. This information is not intended to replace advice given to you by your health care provider. Make sure you discuss any questions you have with your health care provider. Document Revised: 04/03/2021 Document Reviewed: 04/03/2021 Elsevier Patient Education  2024 [employer].

## 2023-05-01 ENCOUNTER — Telehealth: Payer: Self-pay

## 2023-05-01 NOTE — Telephone Encounter (Signed)
 No answer after follow up call. Voice message left.

## 2023-05-06 ENCOUNTER — Encounter: Payer: Self-pay | Admitting: Pediatrics

## 2023-05-06 LAB — SURGICAL PATHOLOGY

## 2023-05-12 ENCOUNTER — Encounter: Payer: Self-pay | Admitting: Cardiovascular Disease

## 2023-05-12 NOTE — Progress Notes (Unsigned)
 Cardiology Office Note:    Date:  05/13/2023   ID:  Jerry Martin, DOB 12/22/1955, MRN [id number]  PCP:  Jerry Grandchild, MD   [facility] Providers Cardiologist:  Jerry Martin    Referring MD: Jerry Grandchild, MD   Chief Complaint  Patient presents with   Hyperlipidemia    History of Present Illness:    Jerry Martin is a 68 y.o. male with a hx of hyperlipidemia, hypertension and a new diagnosis of right bundle branch block.  Jerry Martin is very healthy.  He exercises routinely.  He never has any episodes of chest pain or shortness of breath He walks, bikes, works out on the [employer]  Has a history of hyperlipidemia.  His last LDL was 155.  He started rosuvastatin 20 mg a day recently.  His blood pressure used to be a little bit higher.  He is made a conscious effort to improve his diet and his blood Pressure has been generally lower over the past couple of years.  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  Was taking lots of ibuprofen,  developed anemia  He did not see any black or tarry stools Anemia has resolved.  Colonoscopy and EGD looked good Going well from a cardiac standpoint   His las LDL is 48 in Jan. 2025   Is having some muscle stiffness,  does not keep him from doing his normal routine      Past Medical History:  Diagnosis Date   Anemia    GERD (gastroesophageal reflux disease)    Glaucoma    left eye only   Hypercholesteremia    Hypertension    RBBB     Past Surgical History:  Procedure Laterality Date   BUBBLE STUDY  12/21/2021   Procedure: BUBBLE STUDY;  Surgeon: Christell Constant, MD;  Location: [facility] ENDOSCOPY;  Service: Cardiovascular;;   COLONOSCOPY     FOOT SURGERY Right 10/20/2022   HERNIA REPAIR     as a toddler   TEE  WITHOUT CARDIOVERSION N/A 12/21/2021   Procedure: TRANSESOPHAGEAL ECHOCARDIOGRAM (TEE);  Surgeon: Christell Constant, MD;  Location[facility] ENDOSCOPY;  Service: Cardiovascular;  Laterality: N/A;   UPPER GASTROINTESTINAL ENDOSCOPY     UVULECTOMY      Current Medications: Current Meds  Medication Sig   esomeprazole (NEXIUM) 40 MG capsule Take 1 capsule (40 mg total) by mouth 2 (two) times daily before a meal.   ezetimibe (ZETIA) 10 MG tablet Take 1 tablet (10 mg total) by mouth daily.   ferrous sulfate 325 (65 FE) MG tablet Take 1 tablet (325 mg total) by mouth daily.   Multiple Vitamin (MULTIVITAMIN) capsule Take 1 capsule by mouth daily.   rosuvastatin (CRESTOR) 20 MG tablet Take 1 tablet (20 mg total) by mouth daily.   SIMBRINZA 1-0.2 % SUSP Place 1 drop into the left eye 3 (three) times daily. Glaucoma     Allergies:   Irbesartan and Penicillins   Social History   Socioeconomic History   Marital status: Married    Spouse name: Not on file   Number of children: Not on file   Years of education: Not on file   Highest education level: Master's degree (e.g., MA, MS,  MEng, MEd, MSW, MBA)  Occupational History   Not on file  Tobacco Use   Smoking status: Some Days    Types: Cigars    Passive exposure: Never   Smokeless tobacco: Never  Vaping Use   Vaping status: Never Used  Substance and Sexual Activity   Alcohol use: Not Currently    Alcohol/week: 0.0 standard drinks of alcohol   Drug use: Not Currently    Types: Marijuana   Sexual activity: Yes    Partners: Female  Other Topics Concern   Not on file  Social History Narrative   Not on file   Social Drivers of Martin   Financial Resource Strain: Low Risk  (03/08/2023)   Overall Financial Resource Strain (CARDIA)    Difficulty of Paying Living Expenses: Not hard at all  Food Insecurity: No Food Insecurity (03/28/2023)   Hunger Vital Sign    Worried About Running Out of Food in the Last Year: Never true    Ran Out of  Food in the Last Year: Never true  Transportation Needs: No Transportation Needs (03/28/2023)   PRAPARE - Administrator, Civil Service (Medical): No    Lack of Transportation (Non-Medical): No  Physical Activity: Insufficiently Active (03/08/2023)   Exercise Vital Sign    Days of Exercise per Week: 4 days    Minutes of Exercise per Session: 30 min  Stress: Stress Concern Present (03/08/2023)   Jerry Martin - Occupational Stress Questionnaire    Feeling of Stress : To some extent  Social Connections: Socially Integrated (03/08/2023)   Social Connection and Isolation Panel [NHANES]    Frequency of Communication with Friends and Family: More than three times a week    Frequency of Social Gatherings with Friends and Family: Twice a week    Attends Religious Services: More than 4 times per year    Active Member of [employer] or Organizations: Yes    Attends Engineer, structural: More than 4 times per year    Marital Status: Married     Family History: The patient's family history includes Hypertension in his mother. There is no history of Colon cancer, Esophageal cancer, Rectal cancer, or Stomach cancer.  ROS:   Please see the history of present illness.     All other systems reviewed and are negative.  EKGs/Labs/Other Studies Reviewed:    The following studies were reviewed today:   EKG:         Recent Labs: 02/15/2023: B Natriuretic Peptide 52.7 03/12/2023: TSH 2.13 04/07/2023: ALT 20; BUN 14; Creatinine, Ser 1.05; Hemoglobin 12.5; Platelets 264.0; Potassium 4.5; Sodium 140  Recent Lipid Panel    Component Value Date/Time   CHOL 120 03/12/2023 0941   CHOL 121 03/28/2022 0959   TRIG 125.0 03/12/2023 0941   HDL 46.70 03/12/2023 0941   HDL 46 03/28/2022 0959   CHOLHDL 3 03/12/2023 0941   VLDL 25.0 03/12/2023 0941   LDLCALC 48 03/12/2023 0941   LDLCALC 49 03/28/2022 0959     Risk Assessment/Calculations:       Physical Exam:     Physical Exam: Blood pressure 124/78, pulse (!) 56, height 6\' 1"  (1.854 m), weight 229 lb 12.8 oz (104.2 kg), SpO2 96%.       GEN:  Well nourished, well developed in no acute distress HEENT: Normal NECK: No JVD; No carotid bruits LYMPHATICS: No lymphadenopathy CARDIAC: RRR , no murmurs, rubs, gallops RESPIRATORY:  Clear to auscultation without rales, wheezing or  rhonchi  ABDOMEN: Soft, non-tender, non-distended MUSCULOSKELETAL:  No edema; No deformity  SKIN: Warm and dry NEUROLOGIC:  Alert and oriented x 3   ASSESSMENT:    1. New onset right bundle branch block (RBBB)   2. Hyperlipidemia with target LDL less than 130     PLAN:     Right bundle branch block:   2.  Hyperlipidemia: Lipids look great.  Continue current medications.          Medication Adjustments/Labs and Tests Ordered: Current medicines are reviewed at length with the patient today.  Concerns regarding medicines are outlined above.  No orders of the defined types were placed in this encounter.  No orders of the defined types were placed in this encounter.    Patient Instructions  Follow-Up: At [facility], you and your Martin needs are our priority.  As part of our continuing mission to provide you with exceptional heart care, we have created designated Provider Care Teams.  These Care Teams include your primary Cardiologist (physician) and Advanced Practice Providers (APPs -  Physician Assistants and Nurse Practitioners) who all work together to provide you with the care you need, when you need it.  Your next appointment:   1 year(s)  Provider:   Kristeen Miss, MD  Other Instructions   1st Floor: - Lobby - Registration  - Pharmacy  - Lab - Cafe  2nd Floor: - PV Lab - Diagnostic Testing (echo, CT, nuclear med)  3rd Floor: - Vacant  4th Floor: - TCTS (cardiothoracic surgery) - AFib Clinic - Structural Heart Clinic - Vascular Surgery  - Vascular Ultrasound  5th  Floor: - HeartCare Cardiology (general and EP) - Clinical Pharmacy for coumadin, hypertension, lipid, weight-loss medications, and med management appointments    Valet parking services will be available as well.     Signed, Kristeen Miss, MD  05/13/2023 5:39 PM    [facility]

## 2023-05-13 ENCOUNTER — Encounter: Payer: Self-pay | Admitting: Cardiovascular Disease

## 2023-05-13 ENCOUNTER — Ambulatory Visit: Payer: PPO | Attending: Cardiovascular Disease | Admitting: Cardiovascular Disease

## 2023-05-13 VITALS — BP 124/78 | HR 56 | Ht 73.0 in | Wt 229.8 lb

## 2023-05-13 DIAGNOSIS — E785 Hyperlipidemia, unspecified: Secondary | ICD-10-CM

## 2023-05-13 DIAGNOSIS — I4510 Unspecified right bundle-branch block: Secondary | ICD-10-CM | POA: Diagnosis not present

## 2023-05-13 NOTE — Patient Instructions (Signed)
 Follow-Up: At [facility], you and your health needs are our priority.  As part of our continuing mission to provide you with exceptional heart care, we have created designated Provider Care Teams.  These Care Teams include your primary Cardiologist (physician) and Advanced Practice Providers (APPs -  Physician Assistants and Nurse Practitioners) who all work together to provide you with the care you need, when you need it.  Your next appointment:   1 year(s)  Provider:   Kristeen Miss, MD  Other Instructions   1st Floor: - Lobby - Registration  - Pharmacy  - Lab - Cafe  2nd Floor: - PV Lab - Diagnostic Testing (echo, CT, nuclear med)  3rd Floor: - Vacant  4th Floor: - TCTS (cardiothoracic surgery) - AFib Clinic - Structural Heart Clinic - Vascular Surgery  - Vascular Ultrasound  5th Floor: - HeartCare Cardiology (general and EP) - Clinical Pharmacy for coumadin, hypertension, lipid, weight-loss medications, and med management appointments    Valet parking services will be available as well.

## 2023-05-21 DIAGNOSIS — Q66229 Congenital metatarsus adductus, unspecified foot: Secondary | ICD-10-CM | POA: Diagnosis not present

## 2023-05-21 DIAGNOSIS — M2011 Hallux valgus (acquired), right foot: Secondary | ICD-10-CM | POA: Diagnosis not present

## 2023-05-21 DIAGNOSIS — M2041 Other hammer toe(s) (acquired), right foot: Secondary | ICD-10-CM | POA: Diagnosis not present

## 2023-05-26 ENCOUNTER — Other Ambulatory Visit (HOSPITAL_COMMUNITY): Payer: Self-pay

## 2023-05-26 ENCOUNTER — Other Ambulatory Visit: Payer: Self-pay | Admitting: Internal Medicine

## 2023-05-26 DIAGNOSIS — M51369 Other intervertebral disc degeneration, lumbar region without mention of lumbar back pain or lower extremity pain: Secondary | ICD-10-CM

## 2023-05-26 DIAGNOSIS — M545 Low back pain, unspecified: Secondary | ICD-10-CM

## 2023-05-26 MED ORDER — CELECOXIB 100 MG PO CAPS
100.0000 mg | ORAL_CAPSULE | Freq: Every day | ORAL | 0 refills | Status: DC
  Filled 2023-05-26 (×2): qty 90, 90d supply, fill #0

## 2023-05-27 ENCOUNTER — Encounter: Payer: Self-pay | Admitting: Pharmacist

## 2023-05-27 ENCOUNTER — Other Ambulatory Visit: Payer: Self-pay

## 2023-05-28 ENCOUNTER — Other Ambulatory Visit (HOSPITAL_COMMUNITY): Payer: Self-pay

## 2023-06-27 ENCOUNTER — Inpatient Hospital Stay: Payer: PPO | Attending: Oncology

## 2023-06-27 ENCOUNTER — Encounter: Payer: Self-pay | Admitting: Oncology

## 2023-06-27 ENCOUNTER — Inpatient Hospital Stay: Payer: PPO | Admitting: Oncology

## 2023-06-27 VITALS — BP 128/81 | HR 51 | Temp 97.3°F | Resp 18 | Wt 225.4 lb

## 2023-06-27 DIAGNOSIS — K2100 Gastro-esophageal reflux disease with esophagitis, without bleeding: Secondary | ICD-10-CM | POA: Insufficient documentation

## 2023-06-27 DIAGNOSIS — D509 Iron deficiency anemia, unspecified: Secondary | ICD-10-CM | POA: Diagnosis not present

## 2023-06-27 DIAGNOSIS — D500 Iron deficiency anemia secondary to blood loss (chronic): Secondary | ICD-10-CM

## 2023-06-27 DIAGNOSIS — K2950 Unspecified chronic gastritis without bleeding: Secondary | ICD-10-CM | POA: Diagnosis not present

## 2023-06-27 DIAGNOSIS — K449 Diaphragmatic hernia without obstruction or gangrene: Secondary | ICD-10-CM | POA: Insufficient documentation

## 2023-06-27 LAB — CBC WITH DIFFERENTIAL (CANCER CENTER ONLY)
Abs Immature Granulocytes: 0.02 10*3/uL (ref 0.00–0.07)
Basophils Absolute: 0.1 10*3/uL (ref 0.0–0.1)
Basophils Relative: 1 %
Eosinophils Absolute: 0.6 10*3/uL — ABNORMAL HIGH (ref 0.0–0.5)
Eosinophils Relative: 8 %
HCT: 44 % (ref 39.0–52.0)
Hemoglobin: 14.9 g/dL (ref 13.0–17.0)
Immature Granulocytes: 0 %
Lymphocytes Relative: 30 %
Lymphs Abs: 2.2 10*3/uL (ref 0.7–4.0)
MCH: 27.5 pg (ref 26.0–34.0)
MCHC: 33.9 g/dL (ref 30.0–36.0)
MCV: 81.3 fL (ref 80.0–100.0)
Monocytes Absolute: 0.6 10*3/uL (ref 0.1–1.0)
Monocytes Relative: 8 %
Neutro Abs: 3.9 10*3/uL (ref 1.7–7.7)
Neutrophils Relative %: 53 %
Platelet Count: 213 10*3/uL (ref 150–400)
RBC: 5.41 MIL/uL (ref 4.22–5.81)
RDW: 16.5 % — ABNORMAL HIGH (ref 11.5–15.5)
WBC Count: 7.3 10*3/uL (ref 4.0–10.5)
nRBC: 0 % (ref 0.0–0.2)

## 2023-06-27 LAB — IRON AND IRON BINDING CAPACITY (CC-WL,HP ONLY)
Iron: 138 ug/dL (ref 45–182)
Saturation Ratios: 42 % — ABNORMAL HIGH (ref 17.9–39.5)
TIBC: 332 ug/dL (ref 250–450)
UIBC: 194 ug/dL (ref 117–376)

## 2023-06-27 LAB — FERRITIN: Ferritin: 58 ng/mL (ref 24–336)

## 2023-06-27 NOTE — Assessment & Plan Note (Signed)
 GERD symptoms have improved significantly with Nexium . He is taking Nexium  once daily and has discontinued Pepcid. Long-term use of Nexium  may cause electrolyte absorption issues, manageable with monitoring and multivitamin supplementation. No current side effects reported. - Continue Nexium  once daily - Monitor electrolytes periodically - Consider multivitamin supplementation

## 2023-06-27 NOTE — Progress Notes (Signed)
 [facility]  HEMATOLOGY CLINIC PROGRESS NOTE  PATIENT NAME: Jerry Martin   MR#: [id number] DOB: 09-May-1955  Patient Care Team: Arcadio Knuckles, MD as PCP - General (Internal Medicine)  Date of visit: 06/27/2023   ASSESSMENT & PLAN:   Arinzechukwu Macquarrie is a 68 y.o. gentleman with a past medical history of dyslipidemia, hypertension, BPH, GERD with esophagitis, hiatal hernia, was referred to our service in February 2025 for evaluation of iron deficiency anemia.     Iron deficiency anemia due to chronic blood loss Iron deficiency anemia likely secondary to chronic blood loss, possibly exacerbated by ibuprofen use post-foot surgery.    Hemoglobin improved from 7.6 in December 2024 to 11.2 as of 03/28/2023.   On his initial consultation with us  on 03/28/2023, labs showed hemoglobin of 11.2, MCV 80.5.  Iron saturation decreased at 4%, iron decreased at 18, ferritin decreased at 17. Patient tolerating oral iron well without side effects.   Given persistent iron deficiency anemia, we proceeded with IV iron using Feraheme x 2 doses.   Colonoscopy on 04/30/2023 showed diverticulosis in the sigmoid colon.  Three 4-8 mm polyps in the rectum, in the ascending colon and in the cecum, removed.  Pathology showed them to be tubular adenomas and hyperplastic polyps.  No evidence of high-grade dysplasia or carcinoma.  EGD was unremarkable.  Repeat labs on 06/27/2023 showed normal hemoglobin of 14.9, MCV normal at 81.3.  Iron studies show no evidence of iron deficiency.  He was advised to continue oral iron supplements for now.  RTC in 6 months for follow-up with repeat labs including iron studies.  If stable labs, he can be discharged from our office after next visit.  Gastroesophageal reflux disease with esophagitis GERD symptoms have improved significantly with Nexium . He is taking Nexium  once daily and has discontinued Pepcid. Long-term use of Nexium  may cause electrolyte absorption issues,  manageable with monitoring and multivitamin supplementation. No current side effects reported. - Continue Nexium  once daily - Monitor electrolytes periodically - Consider multivitamin supplementation  I spent a total of 20 minutes during this encounter with the patient including review of chart and various tests results, discussions about plan of care and coordination of care plan.  I reviewed lab results and outside records for this visit and discussed relevant results with the patient. Diagnosis, plan of care and treatment options were also discussed in detail with the patient. Opportunity provided to ask questions and answers provided to his apparent satisfaction. Provided instructions to call our clinic with any problems, questions or concerns prior to return visit. I recommended to continue follow-up with PCP and sub-specialists. He verbalized understanding and agreed with the plan. No barriers to learning was detected.  Arlo Berber, MD  06/27/2023 4:26 PM  [facility] - A DEPT OF [place] [address] [place] [address] Dept: [phone] Dept Fax: [phone]   CHIEF COMPLAINT/ REASON FOR VISIT:  Follow-up for history of iron deficiency anemia, presumed to be from GI blood loss from prior ibuprofen usage after foot surgery  INTERVAL HISTORY:  Discussed the use of AI scribe software for clinical note transcription with the patient, who gave verbal consent to proceed.  History of Present Illness Jerry Martin is a 68 year old male with iron deficiency anemia who presents for follow-up after iron infusions.  He has experienced a steady improvement in his condition following two iron infusions. His hemoglobin level  has increased from 11.2 to 14.9, indicating a positive response to the treatment. He continues to take oral iron supplements in the afternoon without experiencing constipation or nausea.  He underwent  a colonoscopy and endoscopy, which revealed benign polyps and diverticulosis, but no concerning bleeding issues. Chronic gastritis was noted, likely due to previous Advil use, but no ulcers or infections were found. Biopsies taken during the procedures were normal.  He has been taking Nexium  once daily, which has completely resolved his reflux symptoms. He no longer requires Pepcid and reports no side effects from Nexium . No constipation or nausea.    SUMMARY OF HEMATOLOGIC HISTORY:  On routine labs at his PCPs office on 03/12/2023, hemoglobin was 9.9, hematocrit 32.1, MCV 80.  White count 5700 with normal differential, platelet count normal at 360,000.  Vitamin B1, zinc , TSH, PSA were all within normal limits.  Iron studies showed evidence of iron deficiency with ferritin of 15, iron saturation decreased at 5.8%, iron decreased at 27.  He was referred to us  for further evaluation and management of iron deficiency anemia.   The patient reported that he had foot surgery in September 2024 and had been taking a lot of ibuprofen during his recovery. In December, the patient experienced a significant drop in hemoglobin and iron levels, which led to a blood transfusion. The patient has since stopped taking ibuprofen and has been taking oral iron supplements. The patient reported feeling much better and his hemoglobin levels have been improving. The patient also mentioned that he has a hiatal hernia and takes Nexium  for it. He had a colonoscopy 15 years ago and a Cologuard test a couple of years ago.   Iron deficiency anemia likely secondary to chronic blood loss, possibly exacerbated by ibuprofen use post-foot surgery.    Hemoglobin improved from 7.6 in December 2024 to 11.2 as of 03/28/2023.   On his initial consultation with us  on 03/28/2023, labs showed hemoglobin of 11.2, MCV 80.5.  Iron saturation decreased at 4%, iron decreased at 18, ferritin decreased at 17. Patient tolerating oral iron well without  side effects.   Given persistent iron deficiency anemia, we proceeded with IV iron using Feraheme x 2 doses.   Colonoscopy on 04/30/2023 showed diverticulosis in the sigmoid colon.  Three 4-8 mm polyps in the rectum, in the ascending colon and in the cecum, removed.  Pathology showed them to be tubular adenomas and hyperplastic polyps.  No evidence of high-grade dysplasia or carcinoma.  EGD was unremarkable.  Repeat labs on 06/27/2023 showed normal hemoglobin of 14.9, MCV normal at 81.3.  Iron studies show no evidence of iron deficiency.  He was advised to continue oral iron supplements for now.  I have reviewed the past medical history, past surgical history, social history and family history with the patient and they are unchanged from previous note.  ALLERGIES: He is allergic to irbesartan  and penicillins.  MEDICATIONS:  Current Outpatient Medications  Medication Sig Dispense Refill   esomeprazole  (NEXIUM ) 40 MG capsule Take 1 capsule (40 mg total) by mouth 2 (two) times daily before a meal. 180 capsule 1   ezetimibe  (ZETIA ) 10 MG tablet Take 1 tablet (10 mg total) by mouth daily. 90 tablet 1   ferrous sulfate  325 (65 FE) MG tablet Take 1 tablet (325 mg total) by mouth daily. 30 tablet 0   Multiple Vitamin (MULTIVITAMIN) capsule Take 1 capsule by mouth daily.     rosuvastatin  (CRESTOR ) 20 MG tablet Take 1 tablet (20 mg total) by  mouth daily. 90 tablet 2   SIMBRINZA 1-0.2 % SUSP Place 1 drop into the left eye 3 (three) times daily. Glaucoma     No current facility-administered medications for this visit.     REVIEW OF SYSTEMS:    Review of Systems - Oncology  All other pertinent systems were reviewed with the patient and are negative.  PHYSICAL EXAMINATION:   Onc Performance Status - 06/27/23 1459       ECOG Perf Status   ECOG Perf Status Fully active, able to carry on all pre-disease performance without restriction      KPS SCALE   KPS % SCORE Normal, no compliants, no  evidence of disease             Vitals:   06/27/23 1453  BP: 128/81  Pulse: (!) 51  Resp: 18  Temp: (!) 97.3 F (36.3 C)  SpO2: 95%   Filed Weights   06/27/23 1453  Weight: 225 lb 6.4 oz (102.2 kg)    Physical Exam Constitutional:      General: He is not in acute distress.    Appearance: Normal appearance.  HENT:     Head: Normocephalic and atraumatic.  Eyes:     General: No scleral icterus.    Conjunctiva/sclera: Conjunctivae normal.  Cardiovascular:     Rate and Rhythm: Normal rate and regular rhythm.  Pulmonary:     Effort: Pulmonary effort is normal.  Abdominal:     General: There is no distension.  Neurological:     General: No focal deficit present.     Mental Status: He is alert and oriented to person, place, and time.  Psychiatric:        Mood and Affect: Mood normal.        Behavior: Behavior normal.        Thought Content: Thought content normal.     LABORATORY DATA:   I have reviewed the data as listed.  Results for orders placed or performed in visit on 06/27/23  Iron and Iron Binding Capacity ([facility] only)  Result Value Ref Range   Iron 138 45 - 182 ug/dL   TIBC 161 096 - 045 ug/dL   Saturation Ratios 42 (H) 17.9 - 39.5 %   UIBC 194 117 - 376 ug/dL  CBC with Differential (Cancer Center Only)  Result Value Ref Range   WBC Count 7.3 4.0 - 10.5 K/uL   RBC 5.41 4.22 - 5.81 MIL/uL   Hemoglobin 14.9 13.0 - 17.0 g/dL   HCT 40.9 81.1 - 91.4 %   MCV 81.3 80.0 - 100.0 fL   MCH 27.5 26.0 - 34.0 pg   MCHC 33.9 30.0 - 36.0 g/dL   RDW 78.2 (H) 95.6 - 21.3 %   Platelet Count 213 150 - 400 K/uL   nRBC 0.0 0.0 - 0.2 %   Neutrophils Relative % 53 %   Neutro Abs 3.9 1.7 - 7.7 K/uL   Lymphocytes Relative 30 %   Lymphs Abs 2.2 0.7 - 4.0 K/uL   Monocytes Relative 8 %   Monocytes Absolute 0.6 0.1 - 1.0 K/uL   Eosinophils Relative 8 %   Eosinophils Absolute 0.6 (H) 0.0 - 0.5 K/uL   Basophils Relative 1 %   Basophils Absolute 0.1 0.0 - 0.1 K/uL    Immature Granulocytes 0 %   Abs Immature Granulocytes 0.02 0.00 - 0.07 K/uL    RADIOGRAPHIC STUDIES:  No recent pertinent imaging studies available to review.  Orders Placed This  Encounter  Procedures   CBC with Differential (Cancer Center Only)    Standing Status:   Future    Expected Date:   12/28/2023    Expiration Date:   06/26/2024   Iron and Iron Binding Capacity ([facility] only)    Standing Status:   Future    Expected Date:   12/28/2023    Expiration Date:   06/26/2024   Ferritin    Standing Status:   Future    Expected Date:   12/28/2023    Expiration Date:   06/26/2024     Future Appointments  Date Time Provider Department Center  12/26/2023  2:30 PM [facility] None  12/26/2023  3:00 PM [facility] Sedlak, Gale Jude, MD [facility] None     This document was completed utilizing speech recognition software. Grammatical errors, random word insertions, pronoun errors, and incomplete sentences are an occasional consequence of this system due to software limitations, ambient noise, and hardware issues. Any formal questions or concerns about the content, text or information contained within the body of this dictation should be directly addressed to the provider for clarification.

## 2023-06-27 NOTE — Assessment & Plan Note (Signed)
 Iron deficiency anemia likely secondary to chronic blood loss, possibly exacerbated by ibuprofen use post-foot surgery.    Hemoglobin improved from 7.6 in December 2024 to 11.2 as of 03/28/2023.   On his initial consultation with us  on 03/28/2023, labs showed hemoglobin of 11.2, MCV 80.5.  Iron saturation decreased at 4%, iron decreased at 18, ferritin decreased at 17. Patient tolerating oral iron well without side effects.   Given persistent iron deficiency anemia, we proceeded with IV iron using Feraheme x 2 doses.   Colonoscopy on 04/30/2023 showed diverticulosis in the sigmoid colon.  Three 4-8 mm polyps in the rectum, in the ascending colon and in the cecum, removed.  Pathology showed them to be tubular adenomas and hyperplastic polyps.  No evidence of high-grade dysplasia or carcinoma.  EGD was unremarkable.  Repeat labs on 06/27/2023 showed normal hemoglobin of 14.9, MCV normal at 81.3.  Iron studies show no evidence of iron deficiency.  He was advised to continue oral iron supplements for now.  RTC in 6 months for follow-up with repeat labs including iron studies.  If stable labs, he can be discharged from our office after next visit.

## 2023-08-20 DIAGNOSIS — M960 Pseudarthrosis after fusion or arthrodesis: Secondary | ICD-10-CM | POA: Diagnosis not present

## 2023-08-20 DIAGNOSIS — M2012 Hallux valgus (acquired), left foot: Secondary | ICD-10-CM | POA: Diagnosis not present

## 2023-08-20 DIAGNOSIS — Q66229 Congenital metatarsus adductus, unspecified foot: Secondary | ICD-10-CM | POA: Diagnosis not present

## 2023-08-20 DIAGNOSIS — M2011 Hallux valgus (acquired), right foot: Secondary | ICD-10-CM | POA: Diagnosis not present

## 2023-08-20 DIAGNOSIS — M19072 Primary osteoarthritis, left ankle and foot: Secondary | ICD-10-CM | POA: Diagnosis not present

## 2023-08-20 DIAGNOSIS — M2042 Other hammer toe(s) (acquired), left foot: Secondary | ICD-10-CM | POA: Diagnosis not present

## 2023-08-20 DIAGNOSIS — M2041 Other hammer toe(s) (acquired), right foot: Secondary | ICD-10-CM | POA: Diagnosis not present

## 2023-08-21 ENCOUNTER — Other Ambulatory Visit: Payer: Self-pay | Admitting: Family Medicine

## 2023-08-21 DIAGNOSIS — E785 Hyperlipidemia, unspecified: Secondary | ICD-10-CM

## 2023-08-21 DIAGNOSIS — Z79899 Other long term (current) drug therapy: Secondary | ICD-10-CM

## 2023-08-28 DIAGNOSIS — M960 Pseudarthrosis after fusion or arthrodesis: Secondary | ICD-10-CM | POA: Diagnosis not present

## 2023-09-04 ENCOUNTER — Other Ambulatory Visit: Payer: Self-pay | Admitting: Internal Medicine

## 2023-09-04 DIAGNOSIS — H401222 Low-tension glaucoma, left eye, moderate stage: Secondary | ICD-10-CM | POA: Diagnosis not present

## 2023-09-04 DIAGNOSIS — K2101 Gastro-esophageal reflux disease with esophagitis, with bleeding: Secondary | ICD-10-CM

## 2023-09-04 DIAGNOSIS — H04123 Dry eye syndrome of bilateral lacrimal glands: Secondary | ICD-10-CM | POA: Diagnosis not present

## 2023-09-25 ENCOUNTER — Other Ambulatory Visit: Payer: Self-pay | Admitting: Internal Medicine

## 2023-09-25 ENCOUNTER — Other Ambulatory Visit (HOSPITAL_COMMUNITY): Payer: Self-pay

## 2023-09-25 DIAGNOSIS — M51369 Other intervertebral disc degeneration, lumbar region without mention of lumbar back pain or lower extremity pain: Secondary | ICD-10-CM

## 2023-09-25 DIAGNOSIS — M545 Low back pain, unspecified: Secondary | ICD-10-CM

## 2023-10-02 ENCOUNTER — Other Ambulatory Visit (HOSPITAL_COMMUNITY): Payer: Self-pay

## 2023-10-03 ENCOUNTER — Other Ambulatory Visit (HOSPITAL_COMMUNITY): Payer: Self-pay

## 2023-10-07 DIAGNOSIS — M2042 Other hammer toe(s) (acquired), left foot: Secondary | ICD-10-CM | POA: Diagnosis not present

## 2023-10-07 DIAGNOSIS — Q66229 Congenital metatarsus adductus, unspecified foot: Secondary | ICD-10-CM | POA: Diagnosis not present

## 2023-10-07 DIAGNOSIS — M960 Pseudarthrosis after fusion or arthrodesis: Secondary | ICD-10-CM | POA: Diagnosis not present

## 2023-10-07 DIAGNOSIS — M2012 Hallux valgus (acquired), left foot: Secondary | ICD-10-CM | POA: Diagnosis not present

## 2023-10-07 DIAGNOSIS — M2011 Hallux valgus (acquired), right foot: Secondary | ICD-10-CM | POA: Diagnosis not present

## 2023-10-07 DIAGNOSIS — M19072 Primary osteoarthritis, left ankle and foot: Secondary | ICD-10-CM | POA: Diagnosis not present

## 2023-10-07 DIAGNOSIS — M2041 Other hammer toe(s) (acquired), right foot: Secondary | ICD-10-CM | POA: Diagnosis not present

## 2023-11-04 ENCOUNTER — Encounter: Payer: Self-pay | Admitting: Pharmacist

## 2023-11-04 NOTE — Progress Notes (Signed)
 Pharmacy Quality Measure Review  This patient is appearing on a report for being at risk of failing the adherence measure for cholesterol (statin) medications this calendar year.   Medication: rosuvastatin  Last fill date: 9/6 for 90 day supply  Insurance report was not up to date. No action needed at this time.   [facility], PharmD, [facility] Clinical Pharmacist [phone]

## 2023-12-26 ENCOUNTER — Inpatient Hospital Stay: Admitting: Oncology

## 2023-12-26 ENCOUNTER — Inpatient Hospital Stay: Attending: Oncology

## 2023-12-26 VITALS — BP 126/85 | HR 47 | Temp 97.9°F | Resp 20 | Ht 73.0 in | Wt 233.7 lb

## 2023-12-26 DIAGNOSIS — D509 Iron deficiency anemia, unspecified: Secondary | ICD-10-CM | POA: Insufficient documentation

## 2023-12-26 DIAGNOSIS — D500 Iron deficiency anemia secondary to blood loss (chronic): Secondary | ICD-10-CM

## 2023-12-26 DIAGNOSIS — K5730 Diverticulosis of large intestine without perforation or abscess without bleeding: Secondary | ICD-10-CM | POA: Insufficient documentation

## 2023-12-26 DIAGNOSIS — D122 Benign neoplasm of ascending colon: Secondary | ICD-10-CM | POA: Diagnosis not present

## 2023-12-26 LAB — CBC WITH DIFFERENTIAL (CANCER CENTER ONLY)
Abs Immature Granulocytes: 0.01 K/uL (ref 0.00–0.07)
Basophils Absolute: 0.1 K/uL (ref 0.0–0.1)
Basophils Relative: 1 %
Eosinophils Absolute: 0.3 K/uL (ref 0.0–0.5)
Eosinophils Relative: 5 %
HCT: 43.2 % (ref 39.0–52.0)
Hemoglobin: 14.6 g/dL (ref 13.0–17.0)
Immature Granulocytes: 0 %
Lymphocytes Relative: 40 %
Lymphs Abs: 2.5 K/uL (ref 0.7–4.0)
MCH: 29.9 pg (ref 26.0–34.0)
MCHC: 33.8 g/dL (ref 30.0–36.0)
MCV: 88.3 fL (ref 80.0–100.0)
Monocytes Absolute: 0.5 K/uL (ref 0.1–1.0)
Monocytes Relative: 7 %
Neutro Abs: 3 K/uL (ref 1.7–7.7)
Neutrophils Relative %: 47 %
Platelet Count: 214 K/uL (ref 150–400)
RBC: 4.89 MIL/uL (ref 4.22–5.81)
RDW: 12.6 % (ref 11.5–15.5)
WBC Count: 6.4 K/uL (ref 4.0–10.5)
nRBC: 0 % (ref 0.0–0.2)

## 2023-12-26 LAB — IRON AND IRON BINDING CAPACITY (CC-WL,HP ONLY)
Iron: 162 ug/dL (ref 45–182)
Saturation Ratios: 50 % — ABNORMAL HIGH (ref 17.9–39.5)
TIBC: 326 ug/dL (ref 250–450)
UIBC: 164 ug/dL (ref 117–376)

## 2023-12-26 LAB — FERRITIN: Ferritin: 53 ng/mL (ref 24–336)

## 2023-12-26 NOTE — Progress Notes (Signed)
 Jerry Martin  HEMATOLOGY CLINIC PROGRESS NOTE  PATIENT NAME: Jerry Martin   MR#: [id number] DOB: 1955-03-30  Patient Care Team: Jerry Debby CROME, MD as PCP - General (Internal Medicine)  Date of visit: 12/26/2023   ASSESSMENT & PLAN:   Jerry Martin is a 68 y.o. gentleman with a past medical history of dyslipidemia, hypertension, BPH, GERD with esophagitis, hiatal hernia, was referred to our service in February 2025 for evaluation of iron deficiency anemia.     Iron deficiency anemia due to chronic blood loss Iron deficiency anemia likely secondary to chronic blood loss, possibly exacerbated by ibuprofen use post-foot surgery.    Hemoglobin improved from 7.6 in December 2024 to 11.2 as of 03/28/2023.   On his initial consultation with us  on 03/28/2023, labs showed hemoglobin of 11.2, MCV 80.5.  Iron saturation decreased at 4%, iron decreased at 18, ferritin decreased at 17. Patient tolerating oral iron well without side effects.   Given persistent iron deficiency anemia, we proceeded with IV iron using Feraheme x 2 doses.   Colonoscopy on 04/30/2023 showed diverticulosis in the sigmoid colon.  Three 4-8 mm polyps in the rectum, in the ascending colon and in the cecum, removed.  Pathology showed them to be tubular adenomas and hyperplastic polyps.  No evidence of high-grade dysplasia or carcinoma.  EGD was unremarkable.  Repeat labs on 06/27/2023 showed normal hemoglobin of 14.9, MCV normal at 81.3.  Iron studies show no evidence of iron deficiency.  He was advised to continue oral iron supplements for now.  Labs today showed normal hemoglobin of 14.6, hematocrit 40.2, MCV 88.3.  White count and platelet count are within normal limits.  Iron studies show no evidence of iron deficiency.  He was advised to continue oral iron supplements every other day.  Since no additional hematological intervention or workup is needed, he can be discharged for continued follow-up with his PCP and  gastroenterology.  Please call us  with any questions or concerns.  Thank you for giving us  an opportunity to participate in his care.   I spent a total of 20 minutes during this encounter with the patient including review of chart and various tests results, discussions about plan of care and coordination of care plan.  I reviewed lab results and outside records for this visit and discussed relevant results with the patient. Diagnosis, plan of care and treatment options were also discussed in detail with the patient. Opportunity provided to ask questions and answers provided to his apparent satisfaction. Provided instructions to call our clinic with any problems, questions or concerns prior to return visit. I recommended to continue follow-up with PCP and sub-specialists. He verbalized understanding and agreed with the plan. No barriers to learning was detected.  Jerry Patten, MD  12/26/2023 3:24 PM  [facility] - A DEPT OF  [facility] [address] [place] [address] Dept: [phone] Dept Fax: [phone]   CHIEF COMPLAINT/ REASON FOR VISIT:  Follow-up for history of iron deficiency anemia, presumed to be from GI blood loss from prior ibuprofen usage after foot surgery  INTERVAL HISTORY:  Discussed the use of AI scribe software for clinical note transcription with the patient, who gave verbal consent to proceed.  History of Present Illness  Jerry Martin is a 68 year old male who presents for follow-up of anemia after receiving IV iron treatment.  Approximately six months ago, he experienced a significant drop in  hemoglobin levels, which was treated with two sessions of IV iron. Since then, his hemoglobin has stabilized at 14.6, within the normal range for men. He has not been taking oral iron supplements for the past three to four weeks, yet his levels have remained stable.  He has undergone a comprehensive workup,  including an upper GI endoscopy and colonoscopy, to investigate the cause of his anemia. He mentions a possible issue with ibuprofen use following foot surgery, which may have contributed to microscopic blood loss over time.  He is not currently experiencing any symptoms related to anemia and has not reported any new health concerns since his last visit. No current bleeding issues.    SUMMARY OF HEMATOLOGIC HISTORY:  On routine labs at his PCPs office on 03/12/2023, hemoglobin was 9.9, hematocrit 32.1, MCV 80.  White count 5700 with normal differential, platelet count normal at 360,000.  Vitamin B1, zinc , TSH, PSA were all within normal limits.  Iron studies showed evidence of iron deficiency with ferritin of 15, iron saturation decreased at 5.8%, iron decreased at 27.  He was referred to us  for further evaluation and management of iron deficiency anemia.   The patient reported that he had foot surgery in September 2024 and had been taking a lot of ibuprofen during his recovery. In December, the patient experienced a significant drop in hemoglobin and iron levels, which led to a blood transfusion. The patient has since stopped taking ibuprofen and has been taking oral iron supplements. The patient reported feeling much better and his hemoglobin levels have been improving. The patient also mentioned that he has a hiatal hernia and takes Nexium  for it. He had a colonoscopy 15 years ago and a Cologuard test a couple of years ago.   Iron deficiency anemia likely secondary to chronic blood loss, possibly exacerbated by ibuprofen use post-foot surgery.    Hemoglobin improved from 7.6 in December 2024 to 11.2 as of 03/28/2023.   On his initial consultation with us  on 03/28/2023, labs showed hemoglobin of 11.2, MCV 80.5.  Iron saturation decreased at 4%, iron decreased at 18, ferritin decreased at 17. Patient tolerating oral iron well without side effects.   Given persistent iron deficiency anemia, we proceeded  with IV iron using Feraheme x 2 doses.   Colonoscopy on 04/30/2023 showed diverticulosis in the sigmoid colon.  Three 4-8 mm polyps in the rectum, in the ascending colon and in the cecum, removed.  Pathology showed them to be tubular adenomas and hyperplastic polyps.  No evidence of high-grade dysplasia or carcinoma.  EGD was unremarkable.  Repeat labs on 06/27/2023 showed normal hemoglobin of 14.9, MCV normal at 81.3.  Iron studies show no evidence of iron deficiency.  He was advised to continue oral iron supplements for now.  I have reviewed the past medical history, past surgical history, social history and family history with the patient and they are unchanged from previous note.  ALLERGIES: He is allergic to irbesartan  and penicillins.  MEDICATIONS:  Current Outpatient Medications  Medication Sig Dispense Refill   esomeprazole  (NEXIUM ) 40 MG capsule Take 1 capsule (40 mg total) by mouth 2 (two) times daily before a meal. (Patient taking differently: Take 40 mg by mouth 2 (two) times daily before a meal. Taking once daily) 180 capsule 1   ezetimibe  (ZETIA ) 10 MG tablet TAKE 1 TABLET BY MOUTH DAILY 90 tablet 1   Multiple Vitamin (MULTIVITAMIN) capsule Take 1 capsule by mouth daily.     rosuvastatin  (CRESTOR ) 20 MG  tablet Take 1 tablet (20 mg total) by mouth daily. 90 tablet 2   SIMBRINZA 1-0.2 % SUSP Place 1 drop into the left eye 3 (three) times daily. Glaucoma     ferrous sulfate  325 (65 FE) MG tablet Take 1 tablet (325 mg total) by mouth daily. (Patient not taking: Reported on 12/26/2023) 30 tablet 0   No current facility-administered medications for this visit.     REVIEW OF SYSTEMS:    Review of Systems - Oncology  All other pertinent systems were reviewed with the patient and are negative.  PHYSICAL EXAMINATION:   Onc Performance Status - 12/26/23 1513       ECOG Perf Status   ECOG Perf Status Fully active, able to carry on all pre-disease performance without restriction       KPS SCALE   KPS % SCORE Normal, no compliants, no evidence of disease          Vitals:   12/26/23 1510  BP: 126/85  Pulse: (!) 47  Resp: 20  Temp: 97.9 F (36.6 C)  SpO2: 95%   Filed Weights   12/26/23 1510  Weight: 233 lb 11.2 oz (106 kg)    Physical Exam Constitutional:      General: He is not in acute distress.    Appearance: Normal appearance.  HENT:     Head: Normocephalic and atraumatic.  Eyes:     General: No scleral icterus.    Conjunctiva/sclera: Conjunctivae normal.  Cardiovascular:     Rate and Rhythm: Normal rate and regular rhythm.  Pulmonary:     Effort: Pulmonary effort is normal.  Abdominal:     General: There is no distension.  Neurological:     General: No focal deficit present.     Mental Status: He is alert and oriented to person, place, and time.  Psychiatric:        Mood and Affect: Mood normal.        Behavior: Behavior normal.        Thought Content: Thought content normal.     LABORATORY DATA:   I have reviewed the data as listed.  Results for orders placed or performed in visit on 12/26/23  CBC with Differential ([facility] Only)  Result Value Ref Range   WBC Count 6.4 4.0 - 10.5 K/uL   RBC 4.89 4.22 - 5.81 MIL/uL   Hemoglobin 14.6 13.0 - 17.0 g/dL   HCT 56.7 60.9 - 47.9 %   MCV 88.3 80.0 - 100.0 fL   MCH 29.9 26.0 - 34.0 pg   MCHC 33.8 30.0 - 36.0 g/dL   RDW 87.3 88.4 - 84.4 %   Platelet Count 214 150 - 400 K/uL   nRBC 0.0 0.0 - 0.2 %   Neutrophils Relative % 47 %   Neutro Abs 3.0 1.7 - 7.7 K/uL   Lymphocytes Relative 40 %   Lymphs Abs 2.5 0.7 - 4.0 K/uL   Monocytes Relative 7 %   Monocytes Absolute 0.5 0.1 - 1.0 K/uL   Eosinophils Relative 5 %   Eosinophils Absolute 0.3 0.0 - 0.5 K/uL   Basophils Relative 1 %   Basophils Absolute 0.1 0.0 - 0.1 K/uL   Immature Granulocytes 0 %   Abs Immature Granulocytes 0.01 0.00 - 0.07 K/uL  Iron and Iron Binding Capacity ([facility] only)  Result Value Ref Range   Iron 162  45 - 182 ug/dL   TIBC 673 749 - 549 ug/dL   Saturation Ratios 50 (H) 17.9 -  39.5 %   UIBC 164 117 - 376 ug/dL  Ferritin  Result Value Ref Range   Ferritin 53 24 - 336 ng/mL    RADIOGRAPHIC STUDIES:  No recent pertinent imaging studies available to review.  No orders of the defined types were placed in this encounter.   No future appointments.  This document was completed utilizing speech recognition software. Grammatical errors, random word insertions, pronoun errors, and incomplete sentences are an occasional consequence of this system due to software limitations, ambient noise, and hardware issues. Any formal questions or concerns about the content, text or information contained within the body of this dictation should be directly addressed to the provider for clarification.

## 2023-12-31 DIAGNOSIS — M2012 Hallux valgus (acquired), left foot: Secondary | ICD-10-CM | POA: Diagnosis not present

## 2023-12-31 DIAGNOSIS — M792 Neuralgia and neuritis, unspecified: Secondary | ICD-10-CM | POA: Diagnosis not present

## 2023-12-31 DIAGNOSIS — M960 Pseudarthrosis after fusion or arthrodesis: Secondary | ICD-10-CM | POA: Diagnosis not present

## 2023-12-31 DIAGNOSIS — Q66229 Congenital metatarsus adductus, unspecified foot: Secondary | ICD-10-CM | POA: Diagnosis not present

## 2024-01-04 ENCOUNTER — Encounter: Payer: Self-pay | Admitting: Oncology

## 2024-01-04 NOTE — Assessment & Plan Note (Addendum)
 Iron deficiency anemia likely secondary to chronic blood loss, possibly exacerbated by ibuprofen use post-foot surgery.    Hemoglobin improved from 7.6 in December 2024 to 11.2 as of 03/28/2023.   On his initial consultation with us  on 03/28/2023, labs showed hemoglobin of 11.2, MCV 80.5.  Iron saturation decreased at 4%, iron decreased at 18, ferritin decreased at 17. Patient tolerating oral iron well without side effects.   Given persistent iron deficiency anemia, we proceeded with IV iron using Feraheme x 2 doses.   Colonoscopy on 04/30/2023 showed diverticulosis in the sigmoid colon.  Three 4-8 mm polyps in the rectum, in the ascending colon and in the cecum, removed.  Pathology showed them to be tubular adenomas and hyperplastic polyps.  No evidence of high-grade dysplasia or carcinoma.  EGD was unremarkable.  Repeat labs on 06/27/2023 showed normal hemoglobin of 14.9, MCV normal at 81.3.  Iron studies show no evidence of iron deficiency.  He was advised to continue oral iron supplements for now.  Labs today showed normal hemoglobin of 14.6, hematocrit 40.2, MCV 88.3.  White count and platelet count are within normal limits.  Iron studies show no evidence of iron deficiency.  He was advised to continue oral iron supplements every other day.  Since no additional hematological intervention or workup is needed, he can be discharged for continued follow-up with his PCP and gastroenterology.  Please call us  with any questions or concerns.  Thank you for giving us  an opportunity to participate in his care.

## 2024-01-21 ENCOUNTER — Other Ambulatory Visit: Payer: Self-pay | Admitting: Family Medicine

## 2024-01-21 DIAGNOSIS — E785 Hyperlipidemia, unspecified: Secondary | ICD-10-CM

## 2024-01-21 DIAGNOSIS — Z79899 Other long term (current) drug therapy: Secondary | ICD-10-CM

## 2024-03-03 ENCOUNTER — Encounter: Payer: Self-pay | Admitting: Internal Medicine

## 2024-03-03 ENCOUNTER — Ambulatory Visit: Admitting: Internal Medicine

## 2024-03-03 ENCOUNTER — Ambulatory Visit: Payer: Self-pay | Admitting: Internal Medicine

## 2024-03-03 VITALS — BP 160/88 | HR 49 | Temp 97.7°F | Resp 16 | Ht 73.0 in | Wt 230.8 lb

## 2024-03-03 DIAGNOSIS — Z6830 Body mass index (BMI) 30.0-30.9, adult: Secondary | ICD-10-CM

## 2024-03-03 DIAGNOSIS — E66811 Obesity, class 1: Secondary | ICD-10-CM | POA: Diagnosis not present

## 2024-03-03 DIAGNOSIS — M19079 Primary osteoarthritis, unspecified ankle and foot: Secondary | ICD-10-CM | POA: Insufficient documentation

## 2024-03-03 DIAGNOSIS — N400 Enlarged prostate without lower urinary tract symptoms: Secondary | ICD-10-CM

## 2024-03-03 DIAGNOSIS — E785 Hyperlipidemia, unspecified: Secondary | ICD-10-CM

## 2024-03-03 DIAGNOSIS — I10 Essential (primary) hypertension: Secondary | ICD-10-CM

## 2024-03-03 DIAGNOSIS — M216X2 Other acquired deformities of left foot: Secondary | ICD-10-CM | POA: Insufficient documentation

## 2024-03-03 DIAGNOSIS — E6609 Other obesity due to excess calories: Secondary | ICD-10-CM | POA: Diagnosis not present

## 2024-03-03 DIAGNOSIS — R001 Bradycardia, unspecified: Secondary | ICD-10-CM | POA: Diagnosis not present

## 2024-03-03 DIAGNOSIS — Q66229 Congenital metatarsus adductus, unspecified foot: Secondary | ICD-10-CM | POA: Insufficient documentation

## 2024-03-03 DIAGNOSIS — M2041 Other hammer toe(s) (acquired), right foot: Secondary | ICD-10-CM | POA: Insufficient documentation

## 2024-03-03 DIAGNOSIS — M2010 Hallux valgus (acquired), unspecified foot: Secondary | ICD-10-CM | POA: Insufficient documentation

## 2024-03-03 LAB — LIPID PANEL
Cholesterol: 127 mg/dL (ref 28–200)
HDL: 38.2 mg/dL — ABNORMAL LOW
LDL Cholesterol: 65 mg/dL (ref 10–99)
NonHDL: 88.45
Total CHOL/HDL Ratio: 3
Triglycerides: 115 mg/dL (ref 10.0–149.0)
VLDL: 23 mg/dL (ref 0.0–40.0)

## 2024-03-03 LAB — HEPATIC FUNCTION PANEL
ALT: 23 U/L (ref 3–53)
AST: 20 U/L (ref 5–37)
Albumin: 4.2 g/dL (ref 3.5–5.2)
Alkaline Phosphatase: 67 U/L (ref 39–117)
Bilirubin, Direct: 0.1 mg/dL (ref 0.1–0.3)
Total Bilirubin: 0.5 mg/dL (ref 0.2–1.2)
Total Protein: 6.8 g/dL (ref 6.0–8.3)

## 2024-03-03 LAB — URINALYSIS, ROUTINE W REFLEX MICROSCOPIC
Bilirubin Urine: NEGATIVE
Hgb urine dipstick: NEGATIVE
Ketones, ur: NEGATIVE
Leukocytes,Ua: NEGATIVE
Nitrite: NEGATIVE
RBC / HPF: NONE SEEN
Specific Gravity, Urine: 1.02 (ref 1.000–1.030)
Total Protein, Urine: NEGATIVE
Urine Glucose: NEGATIVE
Urobilinogen, UA: 0.2 (ref 0.0–1.0)
WBC, UA: NONE SEEN
pH: 6 (ref 5.0–8.0)

## 2024-03-03 LAB — BASIC METABOLIC PANEL WITH GFR
BUN: 17 mg/dL (ref 6–23)
CO2: 27 meq/L (ref 19–32)
Calcium: 9.3 mg/dL (ref 8.4–10.5)
Chloride: 103 meq/L (ref 96–112)
Creatinine, Ser: 1.02 mg/dL (ref 0.40–1.50)
GFR: 74.39 mL/min
Glucose, Bld: 95 mg/dL (ref 70–99)
Potassium: 4.5 meq/L (ref 3.5–5.1)
Sodium: 137 meq/L (ref 135–145)

## 2024-03-03 LAB — PSA: PSA: 0.37 ng/mL (ref 0.10–4.00)

## 2024-03-03 LAB — TSH: TSH: 2.22 u[IU]/mL (ref 0.35–5.50)

## 2024-03-03 MED ORDER — INDAPAMIDE 1.25 MG PO TABS: 1.2500 mg | ORAL_TABLET | Freq: Every day | ORAL | 0 refills | Status: AC

## 2024-03-03 MED ORDER — WEGOVY 25 MG PO TABS: 25.0000 mg | ORAL_TABLET | Freq: Every day | ORAL | 0 refills | Status: AC

## 2024-03-03 MED ORDER — WEGOVY 4 MG PO TABS: 4.0000 mg | ORAL_TABLET | Freq: Every day | ORAL | 0 refills | Status: AC

## 2024-03-03 MED ORDER — WEGOVY 9 MG PO TABS: 9.0000 mg | ORAL_TABLET | Freq: Every day | ORAL | 0 refills | Status: AC

## 2024-03-03 MED ORDER — AMLODIPINE BESYLATE 5 MG PO TABS: 5.0000 mg | ORAL_TABLET | Freq: Every day | ORAL | 0 refills | Status: DC

## 2024-03-03 MED ORDER — WEGOVY 1.5 MG PO TABS: 1.5000 mg | ORAL_TABLET | Freq: Every day | ORAL | 0 refills | Status: AC

## 2024-03-03 NOTE — Patient Instructions (Signed)
 Hypertension, Adult High blood pressure (hypertension) is when the force of blood pumping through the arteries is too strong. The arteries are the blood vessels that carry blood from the heart throughout the body. Hypertension forces the heart to work harder to pump blood and may cause arteries to become narrow or stiff. Untreated or uncontrolled hypertension can lead to a heart attack, heart failure, a stroke, kidney disease, and other problems. A blood pressure reading consists of a higher number over a lower number. Ideally, your blood pressure should be below 120/80. The first ("top") number is called the systolic pressure. It is a measure of the pressure in your arteries as your heart beats. The second ("bottom") number is called the diastolic pressure. It is a measure of the pressure in your arteries as the heart relaxes. What are the causes? The exact cause of this condition is not known. There are some conditions that result in high blood pressure. What increases the risk? Certain factors may make you more likely to develop high blood pressure. Some of these risk factors are under your control, including: Smoking. Not getting enough exercise or physical activity. Being overweight. Having too much fat, sugar, calories, or salt (sodium) in your diet. Drinking too much alcohol. Other risk factors include: Having a personal history of heart disease, diabetes, high cholesterol, or kidney disease. Stress. Having a family history of high blood pressure and high cholesterol. Having obstructive sleep apnea. Age. The risk increases with age. What are the signs or symptoms? High blood pressure may not cause symptoms. Very high blood pressure (hypertensive crisis) may cause: Headache. Fast or irregular heartbeats (palpitations). Shortness of breath. Nosebleed. Nausea and vomiting. Vision changes. Severe chest pain, dizziness, and seizures. How is this diagnosed? This condition is diagnosed by  measuring your blood pressure while you are seated, with your arm resting on a flat surface, your legs uncrossed, and your feet flat on the floor. The cuff of the blood pressure monitor will be placed directly against the skin of your upper arm at the level of your heart. Blood pressure should be measured at least twice using the same arm. Certain conditions can cause a difference in blood pressure between your right and left arms. If you have a high blood pressure reading during one visit or you have normal blood pressure with other risk factors, you may be asked to: Return on a different day to have your blood pressure checked again. Monitor your blood pressure at home for 1 week or longer. If you are diagnosed with hypertension, you may have other blood or imaging tests to help your health care provider understand your overall risk for other conditions. How is this treated? This condition is treated by making healthy lifestyle changes, such as eating healthy foods, exercising more, and reducing your alcohol intake. You may be referred for counseling on a healthy diet and physical activity. Your health care provider may prescribe medicine if lifestyle changes are not enough to get your blood pressure under control and if: Your systolic blood pressure is above 130. Your diastolic blood pressure is above 80. Your personal target blood pressure may vary depending on your medical conditions, your age, and other factors. Follow these instructions at home: Eating and drinking  Eat a diet that is high in fiber and potassium, and low in sodium, added sugar, and fat. An example of this eating plan is called the DASH diet. DASH stands for Dietary Approaches to Stop Hypertension. To eat this way: Eat  plenty of fresh fruits and vegetables. Try to fill one half of your plate at each meal with fruits and vegetables. Eat whole grains, such as whole-wheat pasta, brown rice, or whole-grain bread. Fill about one  fourth of your plate with whole grains. Eat or drink low-fat dairy products, such as skim milk or low-fat yogurt. Avoid fatty cuts of meat, processed or cured meats, and poultry with skin. Fill about one fourth of your plate with lean proteins, such as fish, chicken without skin, beans, eggs, or tofu. Avoid pre-made and processed foods. These tend to be higher in sodium, added sugar, and fat. Reduce your daily sodium intake. Many people with hypertension should eat less than 1,500 mg of sodium a day. Do not drink alcohol if: Your health care provider tells you not to drink. You are pregnant, may be pregnant, or are planning to become pregnant. If you drink alcohol: Limit how much you have to: 0-1 drink a day for women. 0-2 drinks a day for men. Know how much alcohol is in your drink. In the [place] one drink equals one 12 oz bottle of beer (355 mL), one 5 oz glass of wine (148 mL), or one 1 oz glass of hard liquor (44 mL). Lifestyle  Work with your health care provider to maintain a healthy body weight or to lose weight. Ask what an ideal weight is for you. Get at least 30 minutes of exercise that causes your heart to beat faster (aerobic exercise) most days of the week. Activities may include walking, swimming, or biking. Include exercise to strengthen your muscles (resistance exercise), such as Pilates or lifting weights, as part of your weekly exercise routine. Try to do these types of exercises for 30 minutes at least 3 days a week. Do not use any products that contain nicotine or tobacco. These products include cigarettes, chewing tobacco, and vaping devices, such as e-cigarettes. If you need help quitting, ask your health care provider. Monitor your blood pressure at home as told by your health care provider. Keep all follow-up visits. This is important. Medicines Take over-the-counter and prescription medicines only as told by your health care provider. Follow directions carefully. Blood  pressure medicines must be taken as prescribed. Do not skip doses of blood pressure medicine. Doing this puts you at risk for problems and can make the medicine less effective. Ask your health care provider about side effects or reactions to medicines that you should watch for. Contact a health care provider if you: Think you are having a reaction to a medicine you are taking. Have headaches that keep coming back (recurring). Feel dizzy. Have swelling in your ankles. Have trouble with your vision. Get help right away if you: Develop a severe headache or confusion. Have unusual weakness or numbness. Feel faint. Have severe pain in your chest or abdomen. Vomit repeatedly. Have trouble breathing. These symptoms may be an emergency. Get help right away. Call 911. Do not wait to see if the symptoms will go away. Do not drive yourself to the hospital. Summary Hypertension is when the force of blood pumping through your arteries is too strong. If this condition is not controlled, it may put you at risk for serious complications. Your personal target blood pressure may vary depending on your medical conditions, your age, and other factors. For most people, a normal blood pressure is less than 120/80. Hypertension is treated with lifestyle changes, medicines, or a combination of both. Lifestyle changes include losing weight, eating a healthy,  low-sodium diet, exercising more, and limiting alcohol. This information is not intended to replace advice given to you by your health care provider. Make sure you discuss any questions you have with your health care provider. Document Revised: 12/12/2020 Document Reviewed: 12/12/2020 Elsevier Patient Education  2024 [employer].

## 2024-03-03 NOTE — Progress Notes (Signed)
 "  Subjective:  Patient ID: Jerry Martin, male    DOB: 1955-09-19  Age: 69 y.o. MRN: [id number]  CC: Gastroesophageal Reflux, Hypertension, and Hyperlipidemia   HPI Jerry Martin presents for f/up ---  Discussed the use of AI scribe software for clinical note transcription with the patient, who gave verbal consent to proceed.  History of Present Illness Jerry Martin is a 69 year old male who presents with concerns about weight gain and foot pain.  He is experiencing weight gain despite maintaining a healthy diet. He has lost about three to four pounds since November but feels heavier. Being retired, he spends more time at home, which might contribute to snacking. He cooks at home, [employer], and believes he is careful about his diet. He wants to lose about twenty-five pounds to improve his overall well-being.  He exercises five to six days a week, engaging in treadmill or elliptical workouts for forty-five minutes to an hour without difficulty. He also does strength training a couple of days a week. No dizziness, lightheadedness, or issues with exertion are reported, and he feels better when he exercises.  He has a history of foot problems, having undergone surgery on his right foot with challenges in bone healing. He is considering surgery on his left foot and believes that losing weight would alleviate pressure on his feet.  No abdominal symptoms such as pain, nausea, vomiting, diarrhea, or constipation are present, and he is regular with bowel movements. No signs of intestinal bleeding have been noted, and he was given a clean bill of health by his hematologist mid last year. He takes Nexium  once a day, which effectively manages his symptoms, and he no longer needs to take Pepcid frequently.  He mentions a past issue with dizziness when he first started on a statin, which resolved after switching to Crestor . No dizziness has been experienced since then.     Outpatient Medications  Prior to Visit  Medication Sig Dispense Refill   esomeprazole  (NEXIUM ) 40 MG capsule Take 1 capsule (40 mg total) by mouth 2 (two) times daily before a meal. (Patient taking differently: Take 40 mg by mouth 2 (two) times daily before a meal. Taking once daily) 180 capsule 1   ezetimibe  (ZETIA ) 10 MG tablet TAKE 1 TABLET BY MOUTH DAILY 90 tablet 1   Multiple Vitamin (MULTIVITAMIN) capsule Take 1 capsule by mouth daily.     rosuvastatin  (CRESTOR ) 20 MG tablet Take 1 tablet (20 mg total) by mouth daily. 90 tablet 2   SIMBRINZA 1-0.2 % SUSP Place 1 drop into the left eye 3 (three) times daily. Glaucoma     ferrous sulfate  325 (65 FE) MG tablet Take 1 tablet (325 mg total) by mouth daily. 30 tablet 0   No facility-administered medications prior to visit.    ROS Review of Systems  Constitutional:  Positive for unexpected weight change (wt gain). Negative for appetite change, chills, diaphoresis and fatigue.  HENT: Negative.    Eyes: Negative.   Respiratory: Negative.  Negative for chest tightness, shortness of breath and wheezing.   Cardiovascular:  Negative for chest pain, palpitations and leg swelling.  Gastrointestinal: Negative.  Negative for abdominal pain, constipation, diarrhea, nausea and vomiting.  Genitourinary: Negative.  Negative for difficulty urinating and dysuria.  Musculoskeletal:  Positive for arthralgias. Negative for joint swelling, myalgias and neck pain.  Neurological:  Negative for dizziness, syncope, weakness, light-headedness and headaches.  Hematological:  Negative for adenopathy. Does not bruise/bleed easily.  Objective:  BP (!) 160/88 (BP Location: Left Arm, Patient Position: Sitting, Cuff Size: Normal) Comment: BP (R) 156/94  Pulse (!) 49   Temp 97.7 F (36.5 C) (Oral)   Resp 16   Ht 6' 1 (1.854 m)   Wt 230 lb 12.8 oz (104.7 kg)   SpO2 96%   BMI 30.45 kg/m   BP Readings from Last 3 Encounters:  03/03/24 (!) 160/88  12/26/23 126/85  06/27/23 128/81     Wt Readings from Last 3 Encounters:  03/03/24 230 lb 12.8 oz (104.7 kg)  12/26/23 233 lb 11.2 oz (106 kg)  06/27/23 225 lb 6.4 oz (102.2 kg)    Physical Exam Vitals reviewed.  Constitutional:      Appearance: Normal appearance.  HENT:     Nose: Nose normal.     Mouth/Throat:     Mouth: Mucous membranes are moist.  Eyes:     General: No scleral icterus.    Conjunctiva/sclera: Conjunctivae normal.  Cardiovascular:     Rate and Rhythm: Bradycardia present.     Heart sounds: Normal heart sounds, S1 normal and S2 normal. No murmur heard.    No friction rub. No gallop.     Comments: EKG--- SB, 47 bpm RBBB LAFB  No Q waves Unchanged  Pulmonary:     Effort: Pulmonary effort is normal.     Breath sounds: No stridor. No wheezing, rhonchi or rales.  Abdominal:     General: Abdomen is flat.     Palpations: There is no mass.     Tenderness: There is no abdominal tenderness. There is no guarding.     Hernia: No hernia is present.  Musculoskeletal:        General: Normal range of motion.     Cervical back: Neck supple.     Right lower leg: No edema.     Left lower leg: No edema.  Skin:    General: Skin is warm and dry.  Neurological:     General: No focal deficit present.     Mental Status: He is alert.  Psychiatric:        Mood and Affect: Mood normal.        Behavior: Behavior normal.     Lab Results  Component Value Date   WBC 6.4 12/26/2023   HGB 14.6 12/26/2023   HCT 43.2 12/26/2023   PLT 214 12/26/2023   GLUCOSE 95 03/03/2024   CHOL 127 03/03/2024   TRIG 115.0 03/03/2024   HDL 38.20 (L) 03/03/2024   LDLCALC 65 03/03/2024   ALT 23 03/03/2024   AST 20 03/03/2024   NA 137 03/03/2024   K 4.5 03/03/2024   CL 103 03/03/2024   CREATININE 1.02 03/03/2024   BUN 17 03/03/2024   CO2 27 03/03/2024   TSH 2.22 03/03/2024   PSA 0.37 03/03/2024    DG Chest 2 View Result Date: 02/15/2023 CLINICAL DATA:  Shortness of breath. EXAM: CHEST - 2 VIEW COMPARISON:   None Available. FINDINGS: Bilateral lung fields are clear. Bilateral costophrenic angles are clear. Normal cardio-mediastinal silhouette. Small retrocardiac hiatal hernia noted. No acute osseous abnormalities. The soft tissues are within normal limits. IMPRESSION: No active cardiopulmonary disease. Electronically Signed   By: Ree Molt M.D.   On: 02/15/2023 17:29    Assessment & Plan:   Hyperlipidemia with target LDL less than 130- LDL goal achieved. Doing well on the statin  -     Lipid panel; Future -     Hepatic  function panel; Future  Primary hypertension- He has not achieved his BP goal. -     Urinalysis, Routine w reflex microscopic; Future -     Basic metabolic panel with GFR; Future -     AMB Referral VBCI Care Management -     Indapamide ; Take 1 tablet (1.25 mg total) by mouth daily.  Dispense: 90 tablet; Refill: 0  Benign prostatic hyperplasia without lower urinary tract symptoms -     Urinalysis, Routine w reflex microscopic; Future -     PSA; Future  Chronic sinus bradycardia- He is asx. -     TSH; Future  Class 1 obesity due to excess calories with serious comorbidity and body mass index (BMI) of 30.0 to 30.9 in adult -     Wegovy ; Take 1 tablet (1.5 mg total) by mouth daily. Daily in AM on an empty stomach with 4 oz of water. Do not eat or drink for 30 minutes after dose.  Dispense: 30 tablet; Refill: 0 -     Wegovy ; Take 1 tablet (4 mg total) by mouth daily. Daily in morning on an empty stomach with 4 oz of water. Do not eat or drink for 30 minutes after dose.  Dispense: 30 tablet; Refill: 0 -     Wegovy ; Take 1 tablet (9 mg total) by mouth daily. Daily in the morning on an empty stomach with 4 oz of water. Do not eat or drink for 30 minutes after dose.  Dispense: 30 tablet; Refill: 0 -     Wegovy ; Take 1 tablet (25 mg total) by mouth daily. Daily in the morning on an empty stomach with 4 oz of water. Do not eat or drink for 30 minutes after dose.  Dispense: 90 tablet;  Refill: 0     Follow-up: Return in about 3 months (around 06/01/2024).  Debby Molt, MD "

## 2024-03-04 ENCOUNTER — Telehealth: Payer: Self-pay | Admitting: *Deleted

## 2024-03-04 NOTE — Addendum Note (Signed)
 Addended by: Alcide MELVENA HERO on: 03/04/2024 02:57 PM   Modules accepted: Orders

## 2024-03-04 NOTE — Progress Notes (Signed)
 Care Guide Pharmacy Note  03/04/2024 Name: Jerry Martin MRN: [id number] DOB: Aug 30, 1955  Referred By: Joshua Debby CROME, MD Reason for referral: Call Attempt #1 and Complex Care Management (Outreach to schedule referral with pharmacist )   Jerry Martin is a 69 y.o. year old male who is a primary care patient of Joshua Debby CROME, MD.  Jerry Martin was referred to the pharmacist for assistance related to: HTN  An unsuccessful telephone outreach was attempted today to contact the patient who was referred to the pharmacy team for assistance with medication management. Additional attempts will be made to contact the patient.  Jerry Martin, CMA, AAMA [place]  [facility], [facility] Guide, Lead Direct Dial: [phone]  Fax: [phone]

## 2024-03-05 NOTE — Progress Notes (Unsigned)
 Care Guide Pharmacy Note  03/05/2024 Name: Jerry Martin MRN: [id number] DOB: Jul 04, 1955  Referred By: Joshua Debby CROME, MD Reason for referral: Call Attempt #1 and Complex Care Management (Outreach to schedule referral with pharmacist )   [name] is a 69 y.o. year old male who is a primary care patient of Joshua Debby CROME, MD.  [name] was referred to the pharmacist for assistance related to: HTN  A second unsuccessful telephone outreach was attempted today to contact the patient who was referred to the pharmacy team for assistance with medication management. Additional attempts will be made to contact the patient.  [name], CMA, AAMA [place]  [facility], [facility] Guide, Lead Direct Dial: [phone]  Fax: [phone]

## 2024-03-08 NOTE — Progress Notes (Signed)
 Care Guide Pharmacy Note  03/08/2024 Name: Jerry Martin MRN: [id number] DOB: 11-29-1955  Referred By: Joshua Debby CROME, MD Reason for referral: Call Attempt #1 and Complex Care Management (Outreach to schedule referral with pharmacist )   Jerry Martin is a 69 y.o. year old male who is a primary care patient of Joshua Debby CROME, MD.  Jerry Martin was referred to the pharmacist for assistance related to: HTN  A third unsuccessful telephone outreach was attempted today to contact the patient who was referred to the pharmacy team for assistance with medication management. The Population Health team is pleased to engage with this patient at any time in the future upon receipt of referral and should he/she be interested in assistance from the [employer] [facility] team.  Jerry Martin [facility]  [facility], [facility] Guide Direct Dial: [phone]  Fax: [phone]
